# Patient Record
Sex: Male | Born: 1935 | Race: White | Hispanic: No | Marital: Married | Smoking: Former smoker
Health system: Southern US, Community
[De-identification: ages and names within clinical notes are randomized; demographics above are authoritative.]

## PROBLEM LIST (undated history)

## (undated) DIAGNOSIS — C4490 Unspecified malignant neoplasm of skin, unspecified: Secondary | ICD-10-CM

## (undated) DIAGNOSIS — E785 Hyperlipidemia, unspecified: Secondary | ICD-10-CM

## (undated) DIAGNOSIS — M1990 Unspecified osteoarthritis, unspecified site: Secondary | ICD-10-CM

## (undated) DIAGNOSIS — I714 Abdominal aortic aneurysm, without rupture, unspecified: Secondary | ICD-10-CM

## (undated) DIAGNOSIS — I2510 Atherosclerotic heart disease of native coronary artery without angina pectoris: Secondary | ICD-10-CM

## (undated) DIAGNOSIS — I770 Arteriovenous fistula, acquired: Secondary | ICD-10-CM

## (undated) DIAGNOSIS — C801 Malignant (primary) neoplasm, unspecified: Secondary | ICD-10-CM

## (undated) DIAGNOSIS — E039 Hypothyroidism, unspecified: Secondary | ICD-10-CM

## (undated) DIAGNOSIS — I499 Cardiac arrhythmia, unspecified: Secondary | ICD-10-CM

## (undated) DIAGNOSIS — K469 Unspecified abdominal hernia without obstruction or gangrene: Secondary | ICD-10-CM

## (undated) DIAGNOSIS — Z87438 Personal history of other diseases of male genital organs: Secondary | ICD-10-CM

## (undated) DIAGNOSIS — K8020 Calculus of gallbladder without cholecystitis without obstruction: Secondary | ICD-10-CM

## (undated) DIAGNOSIS — M419 Scoliosis, unspecified: Secondary | ICD-10-CM

## (undated) DIAGNOSIS — N429 Disorder of prostate, unspecified: Secondary | ICD-10-CM

## (undated) DIAGNOSIS — I10 Essential (primary) hypertension: Secondary | ICD-10-CM

## (undated) DIAGNOSIS — I219 Acute myocardial infarction, unspecified: Secondary | ICD-10-CM

## (undated) DIAGNOSIS — E079 Disorder of thyroid, unspecified: Secondary | ICD-10-CM

## (undated) HISTORY — DX: Essential (primary) hypertension: I10

## (undated) HISTORY — DX: Hyperlipidemia, unspecified: E78.5

## (undated) HISTORY — DX: Disorder of thyroid, unspecified: E07.9

## (undated) HISTORY — DX: Arteriovenous fistula, acquired: I77.0

## (undated) HISTORY — DX: Unspecified osteoarthritis, unspecified site: M19.90

## (undated) HISTORY — DX: Unspecified malignant neoplasm of skin, unspecified: C44.90

## (undated) HISTORY — DX: Disorder of prostate, unspecified: N42.9

## (undated) HISTORY — DX: Unspecified abdominal hernia without obstruction or gangrene: K46.9

## (undated) HISTORY — DX: Atherosclerotic heart disease of native coronary artery without angina pectoris: I25.10

## (undated) HISTORY — PX: CHOLECYSTECTOMY: SHX55

## (undated) HISTORY — DX: Personal history of other diseases of male genital organs: Z87.438

## (undated) HISTORY — DX: Malignant (primary) neoplasm, unspecified: C80.1

## (undated) HISTORY — PX: HERNIA REPAIR: SHX51

---

## 1995-12-22 HISTORY — PX: JOINT REPLACEMENT: SHX530

## 2002-08-04 ENCOUNTER — Ambulatory Visit (HOSPITAL_COMMUNITY): Admission: RE | Admit: 2002-08-04 | Discharge: 2002-08-04 | Payer: Self-pay | Admitting: Gastroenterology

## 2002-08-04 ENCOUNTER — Encounter (INDEPENDENT_AMBULATORY_CARE_PROVIDER_SITE_OTHER): Payer: Self-pay

## 2003-05-21 HISTORY — PX: JOINT REPLACEMENT: SHX530

## 2007-02-19 HISTORY — PX: CORONARY ANGIOPLASTY: SHX604

## 2007-02-25 ENCOUNTER — Inpatient Hospital Stay (HOSPITAL_COMMUNITY): Admission: EM | Admit: 2007-02-25 | Discharge: 2007-03-04 | Payer: Self-pay | Admitting: Emergency Medicine

## 2007-02-25 ENCOUNTER — Encounter (INDEPENDENT_AMBULATORY_CARE_PROVIDER_SITE_OTHER): Payer: Self-pay | Admitting: Cardiology

## 2007-04-11 ENCOUNTER — Encounter (HOSPITAL_COMMUNITY): Admission: RE | Admit: 2007-04-11 | Discharge: 2007-07-10 | Payer: Self-pay | Admitting: Cardiology

## 2007-05-03 ENCOUNTER — Emergency Department (HOSPITAL_COMMUNITY): Admission: EM | Admit: 2007-05-03 | Discharge: 2007-05-03 | Payer: Self-pay | Admitting: Emergency Medicine

## 2007-12-23 ENCOUNTER — Encounter: Admission: RE | Admit: 2007-12-23 | Discharge: 2007-12-23 | Payer: Self-pay | Admitting: Cardiology

## 2008-05-20 HISTORY — PX: MELANOMA EXCISION: SHX5266

## 2008-08-21 ENCOUNTER — Encounter: Admission: RE | Admit: 2008-08-21 | Discharge: 2008-08-21 | Payer: Self-pay | Admitting: Cardiology

## 2008-08-28 ENCOUNTER — Ambulatory Visit (HOSPITAL_COMMUNITY): Admission: RE | Admit: 2008-08-28 | Discharge: 2008-08-28 | Payer: Self-pay | Admitting: Cardiology

## 2009-02-18 ENCOUNTER — Ambulatory Visit: Payer: Self-pay | Admitting: Diagnostic Radiology

## 2009-02-18 ENCOUNTER — Emergency Department (HOSPITAL_BASED_OUTPATIENT_CLINIC_OR_DEPARTMENT_OTHER): Admission: EM | Admit: 2009-02-18 | Discharge: 2009-02-18 | Payer: Self-pay | Admitting: Emergency Medicine

## 2009-09-01 ENCOUNTER — Ambulatory Visit (HOSPITAL_COMMUNITY): Admission: RE | Admit: 2009-09-01 | Discharge: 2009-09-01 | Payer: Self-pay | Admitting: Cardiology

## 2010-08-29 ENCOUNTER — Ambulatory Visit (HOSPITAL_COMMUNITY): Admission: RE | Admit: 2010-08-29 | Discharge: 2010-08-29 | Payer: Self-pay | Admitting: Cardiology

## 2011-04-04 NOTE — Discharge Summary (Signed)
NAMEBLANCHARD, WILLHITE NO.:  [id number]   MEDICAL RECORD NO.:  [id number]          PATIENT TYPE:  INP   LOCATION:  2010                         FACILITY:  [facility]   PHYSICIAN:  Armanda Magic, M.D.     DATE OF BIRTH:  07/09/1932   DATE OF ADMISSION:  02/27/2007  DATE OF DISCHARGE:  03/04/2007                               DISCHARGE SUMMARY   DISCHARGE DIAGNOSES:  1. Atrial fibrillation with rapid ventricular response.  Discharged to      home in normal sinus rhythm on amiodarone.  2. Non ST-elevated myocardial infarction, status post bare metal stent      to the left anterior descending, sequential lesion.  3. Systemic anticoagulation with Coumadin.  4. Hypertension.  5. Dyslipidemia.  6. Osteoarthritis.  7. Occasional alcohol use.   HOSPITAL COURSE:  [name] [name] is a 77 year old patient who developed new  onset of atrial fibrillation, who was admitted with erythema and  hypertension.  He denied any chest pain, shortness of breath or  palpitations.  He did call EMS.  On arrival to the emergency room, he  was in AFib with RVR, and initially he was started on IV Cardizem, as  well as beta blocker.   Over the next 24 hours, cardiac enzymes were obtained and were  consistent with a non ST-segment elevated myocardial infarction.  Maximum CK 845 with an MB fraction of 27.7, troponin 0.58.  Other lab  work showed white count of 5.1, hemoglobin 12.8, hematocrit 37.5,  platelets 150, sodium 136, potassium 4.1, BUN 14, creatinine of 1.16,  TSH 1.852, total cholesterol 137, triglycerides 118, HDL 49, LDL 64.   The patient did have a 2-D echo during the hospitalization that showed  an EF of about 60%.  Wall motion evaluation was not adequate during the  study.  Aortic valve thickness was mild to moderately increased.  There  was mild to moderate anular calcification.  PFTs were also done that  showed moderate to severe neuromuscular disease with administration  bronchodilators, no significant response noted.  EKG after the patient  converted to normal sinus rhythm showed first degree AV block and left  axis deviation; otherwise unremarkable.  V/Q scan showed no evidence of  pulmonary embolism.  We did do an ultrasound of the patient's urinary  tract, which showed no evidence of hydronephrosis.  The reason this was  done was because of an elevated creatinine during the initial part of  the patient's stay.   Because of the patient's elevated enzymes, he underwent a cardiac  catheterization on February 27, 2007 under the care of Dr. Armanda Magic.  The patient was found to have the following:  30% osteal left main, 30%  mid circumflex, 80% mid LAD with luminal irregularities in the right  coronary artery.  At this point, Dr. Eldridge Dace was involved in the  patient's care and attempted percutaneous intervention of the LAD.  Two  tandem lesions that received bare metal stent placement.  An aortogram  showed no aneurysmal defects.   Ultimately, the patient converted back into normal sinus rhythm  under  the utilization of a beta blocker and Cardizem.  Ultimately, we did  switch over and start amiodarone on the patient to hopefully keep him  back in sinus rhythm.  He was eventually discharged to home on March 04, 2007 in stable and improved condition.   DISCHARGE MEDICATIONS ARE AS FOLLOWS:  1. Plavix 75 mg a day for one month.  2. Diltiazem-ER 180 mg a day.  3. Protonix 40 mg a day.  4. Metoprolol-ER 100 mg daily.  5. Warfarin 5 mg 1 tablet daily.  6. Amiodarone 200 mg 2 tablets twice a day for 7 days, 2 tablets daily      for 7 days, then 1 tablet daily.  7. Aspirin 325 mg then go down to 81 mg daily.  8. Lisinopril 20 mg 1 tablet daily.  9. Pravachol 80 mg 1 tablet daily.   The patient was instructed to stop Cardura and simvastatin, and  Accuretic.   DISCHARGE INSTRUCTIONS:  Remain on a low-sodium, heart healthy diet.  Coumadin was also  discussed with the patient.  No lifting over 10 pounds  for one week.  Follow up with Dr. Mayford Knife on March 18, 2007 at 3:15, and  follow up with [facility] for an EKG evaluation on March 11, 2007  at 2 p.m.  Also, the Coumadin clinic at [facility] on March 07, 2007 at 3  p.m.      Guy Franco, P.A.      Armanda Magic, M.D.  Electronically Signed    LB/MEDQ  D:  06/11/2007  T:  06/12/2007  Job:  [id number]   cc:   Armanda Magic, M.D.  Lyndee Leo. Janey Greaser, MD

## 2011-04-07 NOTE — Cardiovascular Report (Signed)
NAME:  [name] [name], [name] [name] NO.:  [id number]   MEDICAL RECORD NO.:  [id number]          PATIENT TYPE:  OUT   LOCATION:  CATH                         FACILITY:  [facility]   PHYSICIAN:  Armanda Magic, M.D.     DATE OF BIRTH:  23-Mar-1932   DATE OF PROCEDURE:  02/27/2007  DATE OF DISCHARGE:                            CARDIAC CATHETERIZATION   PROCEDURE:  Coronary angiography.   OPERATOR:  Armanda Magic, M.D.   INDICATIONS:  Non-ST-elevation MI and atrial fibrillation.   COMPLICATIONS:  None.   INTRAVENOUS ACCESS:  Via right femoral artery 6-French sheath.   This is a very pleasant 77 year old white male who presented with an  episode of presyncope and rapid A-Fib.  Cardiac enzymes were markedly  elevated and he now presents for cardiac catheterization.   The patient was brought to the Cardiac Catheterization Laboratory in a  fasting, nonsedated state.  Informed consent was obtained.  The patient  was connected to continuous heart rate and pulse oximetry monitoring and  intermittent blood pressure monitoring.  The right groin was prepped and  draped in a sterile fashion.  One percent Xylocaine was used for local  anesthesia.  Using the modified Seldinger technique, a 6-French sheath  was placed in the right femoral artery.  Under fluoroscopic guidance, a  6-French JL4 catheter was placed into the vicinity of the left coronary  ostium but could not engage the ostium.  The catheter was exchanged out  over a guidewire for a 6-French JL5 catheter which again could not  engage the coronary ostium.  The catheter was exchanged out over a  guidewire for a 6-French CSF catheter which again could not engage the  coronary ostium.  The catheter was exchanged out over a guidewire for a  6-French AL3 catheter which was successfully engaged in the coronary  ostium.  Multiple cine films were taken at 30-degree RAO and 40-degree  LAO views.  This catheter was then exchanged out over a  guidewire for a  6-French JR4 catheter which was placed under fluoroscopic guidance in  the right coronary artery.  Multiple cine films were taken in 30-degree  RAO, 40-degree LAO views.  This catheter was then exchanged out over a  guidewire.  The patient went on to PCI of the LAD.  Left  ventriculography was not performed because of the patient's history of  renal insufficiency when he was admitted to the hospital.   RESULTS:  1. Left main coronary artery has an ostial 30% narrowing and then      bifurcates in the left anterior descending artery and left      circumflex artery.  The left anterior descending artery gives rise      to a first diagonal branch which is widely patent and bifurcates      into two daughter branches, both of which are widely patent.  There      is then between the first and second diagonal branch an 80%      narrowing of the mid LAD before giving off a second diagonal  branch.  The ongoing LAD traverses the apex and is widely patent.   The left circumflex is widely patent in its proximal mid portions.  It  gives rise to a large obtuse marginal branch which has a 30% narrowing  and then bifurcates into two daughter branches, both of which are widely  patent.  The ongoing circumflex traverses the AV groove and is patent.   The right coronary artery is widely patent with luminal irregularities.  Distally, it bifurcates into posterior descending artery and posterior  lateral artery, both of which are widely patent.   ASSESSMENT:  1. One-vessel obstructive coronary disease.  2. Normal left ventricular function by echo.  3. Non-ST elevation myocardial infarction.  4. Paroxysmal atrial fibrillation with rapid ventricular response in      normal sinus rhythm.   PLAN:  PCI of the LAD.  Aspirin, Plavix, IV heparin, and subsequently  Coumadin.  Toprol XL 25 mg daily.  Check a fasting lipid panel.      Armanda Magic, M.D.  Electronically Signed      TT/MEDQ  D:  02/27/2007  T:  02/27/2007  Job:  [id number]   cc:   DR. Janey Greaser

## 2011-04-07 NOTE — Op Note (Signed)
   NAME:  [name], [name]                            ACCOUNT NO.:  [id number]   MEDICAL RECORD NO.:  [id number]                   PATIENT TYPE:  AMB   LOCATION:  ENDO                                 FACILITY:  [facility]   PHYSICIAN:  James L. Malon Kindle., M.D.          DATE OF BIRTH:  Aug 10, 1932   DATE OF PROCEDURE:  08/04/2002  DATE OF DISCHARGE:                                 OPERATIVE REPORT   PROCEDURE:  Colonoscopy and coagulation of polyps.   MEDICATIONS:  Fentanyl 75 mcg, Versed 6 mg IV.   SCOPE:  Adult Olympus video colonoscope.   INDICATION:  Colon cancer screening.   DESCRIPTION OF PROCEDURE:  The procedure had been explained to the patient  and consent obtained.  The patient went through his prep at home.  He felt  somewhat weak and presented slightly hypotensive with a systolic pressure of  90, and we brought him in early and gave him a liter and a half of fluids,  and he felt much better.  The procedure including the risks and benefits had  been discussed in the office.  The adult Olympus video colonoscope was  inserted and advanced under direct visualization.  The prep was quite good.  Using abdominal pressure, we were able to reach the cecum without  difficulty.  The ileocecal valve and appendiceal orifice were seen.  The  scope was withdrawn, and the cecum, ascending colon, hepatic flexure,  transverse colon, splenic flexure, descending, and sigmoid colon seen well  upon removal.  No significant diverticular disease.  No polyps or tumors  seen.  The scope was withdrawn down into the rectum, and there was a 3 mm  polyp that was cauterized with the hot biopsy forceps.  No other polyps were  seen.  The scope was withdrawn.  The patient tolerated the procedure well.   ASSESSMENT:  Rectal polyp, cauterized.   PLAN:  Routine postpolypectomy instructions.  We will check pathology and  make recommendations further after the pathology is returned.                       James L. Malon Kindle., M.D.    Waldron Session  D:  08/04/2002  T:  08/04/2002  Job:  [id number]   cc:   Al Decant. Janey Greaser, M.D.  [address]  [place]  [place] [address]  Fax: [phone]

## 2011-04-07 NOTE — H&P (Signed)
NAME:  [name] [name], [name] [name] NO.:  [id number]   MEDICAL RECORD NO.:  [id number]          PATIENT TYPE:  INP   LOCATION:  0104                         FACILITY:  [facility]   PHYSICIAN:  Ellie Lunch, M.D.      DATE OF BIRTH:  08/14/1932   DATE OF ADMISSION:  02/25/2007  DATE OF DISCHARGE:                              HISTORY & PHYSICAL   PRIMARY CARE PHYSICIAN:  Al Decant. Janey Greaser, M.D. with [facility]  at [facility].   CHIEF COMPLAINT:  Dizziness and new-onset atrial fibrillation.   HISTORY AND PHYSICAL:  [name] [name] is a very pleasant 77 year old white  male with a past medical history of hypertension and BPH that came in  today because he had an episode of dizziness when the patient initially  woke up.  He took his blood pressure and found it to be very low with  systolics in the 70s and thus called EMS.  He did not give any  complaints of chest pain, palpitations or syncope.  He denies any  nausea, vomiting or diaphoresis.  He denies cough, shortness of breath,  sputum production.   PAST MEDICAL HISTORY:  1. Hypertension.  2. Dyslipidemia.  3. Osteoarthritis.  4. Bilateral hip replacement.  5. Obesity.   MEDICATIONS:  1. Doxazosin 4 mg daily.  2. Quinapril/hydrochlorothiazide 20/12 mg daily.  3. Simvastatin 40 mg at bedtime.  4. Alka-Seltzer p.r.n.   ALLERGIES:  No known drug allergies.   SOCIAL HISTORY:  Lives with his spouse.  He has remote history of  tobacco abuse but quit 20 years ago.  Occasional drinker, no drug abuse.   FAMILY HISTORY:  Mother had some form of arrhythmia, most likely atrial  fibrillation and also history of congestive heart failure.   REVIEW OF SYSTEMS:  Per HPI.   PHYSICAL EXAMINATION:  VITAL SIGNS ON ADMISSION:  Temperature was not  measured, blood pressure 90/69, pulse 142, respirations 20, O2  saturations 96% on room air.  GENERAL:  The patient in no acute distress.  HEENT:  Head atraumatic, normocephalic.  Pupils  equal, round, reactive  to light and accommodation.  NECK:  Supple, no JVD.  CHEST:  Clear to auscultation bilaterally.  No rales, rhonchi, wheezing.  CARDIOVASCULAR:  He does have a regular rhythm which is very fast,  probably in the 120s, with no murmurs.  ABDOMEN:  Soft, obese, with positive bowel sounds.  EXTREMITIES:  No clubbing, cyanosis, or edema.  SKIN:  Moist.   LABORATORY DRAWN IN THE EMERGENCY ROOM:  Hemoglobin was 13, white count  8.2, platelets 179.  X-rays showed cardiomegaly and mild pulmonary  vascular congestion.  EKG showed atrial fibrillation with rapid  ventricular response and left anterior fascicular block.   PLAN:  1. Dizziness, most likely secondary to atrial fibrillation with rapid      ventricular response.  The patient has already been started on      Cardizem drip in the emergency room.  Will continue that drip.      Will work him up for secondary causes of atrial fibrillation by  getting a TSH, a D-dimer, and also cardiac enzymes.  Will also get      a 2-D echocardiogram to look at ejection fraction as well as      valves.  Will go ahead and get a cardiology consult and admit him      on a telemetry bed.  2. Hypotension.  Will currently keep all of his blood pressure      medicines on hold and give him some IV fluids boluses.  Hopefully      with rate control blood pressure will come up.  3. Dyslipidemia.  Will continue his simvastatin.      Ellie Lunch, M.D.  Electronically Signed     BP/MEDQ  D:  02/25/2007  T:  02/25/2007  Job:  [id number]

## 2011-04-07 NOTE — Cardiovascular Report (Signed)
NAME:  [name] [name], [name] [name] NO.:  [id number]   MEDICAL RECORD NO.:  [id number]          PATIENT TYPE:  OUT   LOCATION:  CATH                         FACILITY:  [facility]   PHYSICIAN:  Corky Crafts, MDDATE OF BIRTH:  December 10, 1931   DATE OF PROCEDURE:  02/27/2007  DATE OF DISCHARGE:                            CARDIAC CATHETERIZATION   PROCEDURE:  Attempted PCI of the LAD.   OPERATOR:  Dr. Eldridge Dace.   INDICATIONS:  Non-ST elevation MI.   PROCEDURAL NARRATIVE:  The diagnostic angiogram revealed a 75% mid-LAD  lesion.  There was great difficulty in engaging the coronary vessels  with the usual catheters, even during the diagnostic case.  Initially,  an AL-3 guiding catheter was advanced to the ascending aorta and  attempted to intubate the left main without success.  An attempt was  then made with a JL-6 catheter.  The catheter was brought to the  vicinity of the ostium of the left main.  A Prowater wire was advanced  into the LAD successfully; however, a second wire was advanced into the  LAD for additional support with the hopes of being able to pull the  guide catheter into the ostium of the vessel.  When the second wire,  which initially was a Mailman, was inserted, the guide catheter back  out.  This wire was then pulled out.  The Prowater remained inside the  LAD.  A BMW wire was advanced into the left main, and upon entering the  LAD, the guide again backed out and both wires fell out of the coronary  artery.  An attempt was then made with a CLS 4.5 guiding catheter  without success.  The difficulty came from significant abdominal aortic  tortuosity.  Another attempt was made with the AL-3 guiding catheter  without success.  A 7-French system was used in the hopes of the  additional support being able to help [place] advance equipment into the  coronary artery.  Heparin and Integrilin were used because the patient  got heparin during the diagnostic cath.  No  successful intervention  could be performed.   IMPRESSIONS:  1. Dilated aortic root with significant abdominal aortic tortuosity.  2. Unsuccessful percutaneous coronary intervention attempt to the left      anterior descending because of lack of guide support due to the      above impression.   RECOMMENDATIONS/PLAN:  1. Will plan for PCI of the LAD tomorrow via a left brachial approach.  2. Will give the patient aggressive hydration overnight.  He has a      normal EF by echocardiogram.  3. Will load the patient with Plavix.  Will plan to use Angiomax      tomorrow for the intervention.      Corky Crafts, MD  Electronically Signed     JSV/MEDQ  D:  02/27/2007  T:  02/27/2007  Job:  [id number]

## 2011-04-07 NOTE — Cardiovascular Report (Signed)
NAME:  [name] [name], [name] [name] NO.:  [id number]   MEDICAL RECORD NO.:  [id number]          PATIENT TYPE:  INP   LOCATION:  6524                         FACILITY:  [facility]   PHYSICIAN:  Corky Crafts, MDDATE OF BIRTH:  1932-03-31   DATE OF PROCEDURE:  02/28/2007  DATE OF DISCHARGE:                            CARDIAC CATHETERIZATION   REFERRING PHYSICIAN:  Dr. Armanda Magic.   PROCEDURES PERFORMED:  Left heart catheterization.  Ascending aortogram,  percutaneous coronary intervention of the left anterior descending.   OPERATOR:  Corky Crafts, MD   INDICATIONS:  Non-ST-segment elevation myocardial infarction.   DESCRIPTION OF PROCEDURE:  The risks and benefits of PCI were explained  to the patient and informed consent was obtained.  A 6-French sheath was  placed into his left brachial artery because yesterday we had tried to  perform this PCI via the femoral approach and were unsuccessful.  1%  lidocaine was infiltrated into his subcutaneous tissue above the  brachial artery.  A 6-French sheath was placed into the brachial artery  using modified Seldinger technique.  A CLS 4.5 guiding catheter was  advanced to the ascending aorta and into the ostium of the left main.  A  Prowater wire was advanced across the lesion.  A 3.0 x 12 Liberte stent  was advanced to the LAD.  There were tandem lesions in the mid LAD of  70% and 80%.  The 3.0 x 12 was placed across the more proximal lesion in  the mid LAD and deployed at 18 atmospheres for 56 seconds.  This stent  was then post dilated with a 3.75 x 8 mm PowerSail to 14 atmospheres for  26 seconds.  There was an excellent angiographic result.  The more  distal lesion did appear more significant even after nitroglycerin was  administered into the coronary artery.  A 3.0 x 8 mm Liberte stent was  then advanced across the lesion and deployed at 14 atmospheres for 41  seconds.  There was an excellent angiographic result.   There was no  residual stenosis.  There is TIMI III flow.  A pigtail catheter was then  advanced to the ascending aorta and across the aortic valve.  Hemodynamic pressure assessment was performed.  A pullback was performed  under continuous hemodynamic monitoring.  A power injection of contrast  was then done in the LAO position to visualize the aortic root.  The  sheath will be removed using manual compression.   Ascending aortogram:  The aortic root does not appear dilated.  It is  just quite tortuous.  There is no aneurysm noted.  There is mild  calcification in the aortic arch.   HEMODYNAMIC RESULTS:  Left ventricular pressure 143/7. LVEDP of 9 mmHg.  Aortic pressure 143/76 with a mean aortic pressure of 105 mmHg.   IMPRESSION:  1. Successful percutaneous coronary intervention of the mid left      anterior descending with a 3.0 x 12 mm Liberte stent taken to 3.87      mm in diameter and then a 3.0 x 8 mm  stent taken to 3.4 mm in      diameter.  These were spot stents in the mid left anterior      descending.  2. Normal hemodynamics.  3. No ascending aneurysm.   RECOMMENDATIONS:  The patient is to continue aspirin and Plavix for  least 30 days along with other secondary preventions including lipid  lowering therapy and smoking cessation.      Corky Crafts, MD  Electronically Signed     JSV/MEDQ  D:  02/28/2007  T:  02/28/2007  Job:  [id number]

## 2011-04-07 NOTE — Consult Note (Signed)
NAME:  ZAMIR, STAPLES NO.:  [id number]   MEDICAL RECORD NO.:  [id number]          PATIENT TYPE:  INP   LOCATION:  0104                         [facility]AC[facility]   PHYSICIAN:  Armanda Magic, M.D.     DATE OF BIRTH:  Apr 06, 1932   DATE OF CONSULTATION:  02/25/2007  DATE OF DISCHARGE:                                 CONSULTATION   REFERRING PHYSICIAN:  Dr. Catha Gosselin.   CHIEF COMPLAINT:  New onset atrial fibrillation with RVR.   HISTORY OF PRESENT ILLNESS:  This is a 77 year old male with a history  of arrhythmia, although he has never seen a cardiologist before,  hypertension, dyslipidemia who was admitted with dizziness and  hypotension.  Apparently the patient was in his usual state of health  and walked his dogs last evening.  Apparently while walking them, he  tripped over one of his dogs and fell and hit his head suffering a very  mild ecchymotic area over his right eye as well as a small scrape on the  top of his head.  He awakened this morning feeling very weak and dizzy  and called EMS.  He denied any chest pain, shortness of breath,  palpitations.  On arrival in the ER the patient was an atrial  fibrillation with RVR.  He is now on a Cardizem drip at 10 mg per hour  with heart rates anywhere from 100-130 beats per minute and systolic  blood pressure of 106 mmHg.   PAST MEDICAL HISTORY:  1. Hypertension.  2. Dyslipidemia.  3. Osteoarthritis.   PAST SURGICAL HISTORY:  Status post total hip replacement x2.   ALLERGIES:  HE HAS NO KNOWN DRUG ALLERGIES.   SOCIAL HISTORY:  He is married.  He denies any tobacco or IV drug use.  He does occasionally use alcohol, usually one glass of wine and a  cocktail every day and then increased alcohol on the weekends.   FAMILY HISTORY:  Noncontributory.   REVIEW OF SYSTEMS:  Other than what is in the HPI is negative.   MEDICATIONS:  1. Doxazosin 4 mg daily.  2. Quinapril/hydrochlorothiazide 20/12.5 mg a day.  3. Simvastatin 40 mg nightly.   PHYSICAL EXAM:  VITAL SIGNS:  Blood pressure is 106/62, heart rate 100-  130,  GENERAL APPEARANCE:  He is a well-developed, well-nourished white male  in no acute distress.  HEENT:  Benign except for a small ecchymotic area of the right eye and  head.  NECK:  Supple without lymphadenopathy, no bruits.  LUNGS:  Clear to auscultation bilaterally.  HEART:  Irregularly irregular.  No murmurs, rubs or gallops.  Normal S1  and S2.  ABDOMEN:  Benign.  EXTREMITIES:  No edema.   LABORATORY DATA:  Sodium 141, potassium 4.2, chloride 105, bicarb 20,  BUN 23, creatinine 1.8.  White cell count 8.3, hemoglobin 13, hematocrit  37.2, platelet count 179.  CPK is pending and B12.  Troponin less than  0.05.   EKG shows atrial fibrillation with rapid ventricular response at 136  beats per minute, left anterior fascicular block.  No ST changes.   Chest x-ray shows mild pulmonary vascular congestion and cardiomegaly.   ASSESSMENT:  1. New onset atrial fibrillation with rapid ventricular response of      unknown duration.  2. Hypertension, now with decreased blood pressure.  Questionably      secondary to dehydration.  3. Acute renal insufficiency.  Questionably secondary to dehydration.  4. Dyslipidemia 506 MB was normal troponin.  Question secondary to      renal insufficiency with rapid A fib.  5. Recent fall secondary tripping over his dog with no syncope.   PLAN:  Continue IV Cardizem drip for rate control.  Check a TSH and BNP.  A 2-D echocardiogram once heart rate controlled, check a CPK-MB,  troponin q.8h. x3.  If blood pressure improves with IV fluids, would  consider giving Lopressor 2.5 mg IV and then repeat x1.  If his blood  pressure tolerates it, we could start him on low-dose Lopressor; if not,  we may have to try the amiodarone.  I am going to give him one dose of  digoxin 0.25 mg IV now, but will need to be careful with any further  dosing of  digoxin because of his renal insufficiency.      Armanda Magic, M.D.  Electronically Signed     TT/MEDQ  D:  02/25/2007  T:  02/25/2007  Job:  [id number]   cc:   Caryn Bee L. Little, M.D.  Fax: [phone]

## 2011-06-14 ENCOUNTER — Encounter (INDEPENDENT_AMBULATORY_CARE_PROVIDER_SITE_OTHER): Payer: Self-pay | Admitting: General Surgery

## 2011-06-16 ENCOUNTER — Encounter (INDEPENDENT_AMBULATORY_CARE_PROVIDER_SITE_OTHER): Payer: Self-pay | Admitting: General Surgery

## 2011-06-16 ENCOUNTER — Ambulatory Visit (INDEPENDENT_AMBULATORY_CARE_PROVIDER_SITE_OTHER): Payer: Medicare Other | Admitting: General Surgery

## 2011-06-16 VITALS — BP 120/84 | HR 60 | Temp 96.7°F | Ht 69.0 in | Wt 187.8 lb

## 2011-06-16 DIAGNOSIS — K4090 Unilateral inguinal hernia, without obstruction or gangrene, not specified as recurrent: Secondary | ICD-10-CM

## 2011-06-16 NOTE — Progress Notes (Signed)
[name] [name] is a 77 y.o. male.    Chief Complaint  Patient presents with  . Other    eval of rih    HPI HPI This patient presents for evaluation of a bulge in his right groin which has been present for about 3 months. He states that this is intermittently protruding and he notices approximately once per week. It is reducible when lying flat and currently he denies any symptoms from this. He had a prior left inguinal hernia repair with mesh for what he states is a very large inguinal hernia and he has done well from this. He denies any obstructive symptoms such as nausea vomiting or constipation. He does have a fairly complicated medical history but appears to be very active, still working as a Midwife and working in the yard. He states that currently is remodeling at home.  Past Medical History  Diagnosis Date  . Hypertension   . Hyperlipidemia   . Coronary disease   . CHF (congestive heart failure)   . Systemic AV fistula   . Thyroid disease   . Arthritis   . Cancer   . Vitamin D deficiency   . History of BPH   . Skin cancer   . Hernia   . Prostate disease     Past Surgical History  Procedure Date  . Hernia repair   . Hip surgery 1999 & 2004    Rt and Lt hip  . Melanoma excision     lt forehead  . Joint replacement 12/1995    right  . Joint replacement 05/2003    left    Family History  Problem Relation Age of Onset  . Stroke Mother   . Cancer Brother   . Other Father     upper GI hemorrhage    Social History History  Substance Use Topics  . Smoking status: Former Games developer  . Smokeless tobacco: Former Neurosurgeon    Quit date: 06/16/1983  . Alcohol Use: Yes     1 glass of red wine daily - 2 on special occasions    No Known Allergies  Current Outpatient Prescriptions  Medication Sig Dispense Refill  . acetaminophen (TYLENOL) 650 MG CR tablet Take 650 mg by mouth as needed.        Marland Kitchen amiodarone (PACERONE) 200 MG tablet Take 200 mg by mouth daily.        Marland Kitchen  aspirin 81 MG tablet Take 81 mg by mouth daily.        . betamethasone dipropionate (DIPROLENE) 0.05 % cream Apply topically daily.        . calcium carbonate (TUMS EX) 750 MG chewable tablet Chew 1 tablet by mouth as needed.        . ergocalciferol (VITAMIN D2) 50000 UNITS capsule Take 50 mcg by mouth once a week. Taking D3 per med list provided 06/16/11.      Marland Kitchen ezetimibe (ZETIA) 10 MG tablet Take 10 mg by mouth daily.        . fluorouracil (EFUDEX) 5 % cream Apply topically daily.        Marland Kitchen levothyroxine (SYNTHROID, LEVOTHROID) 88 MCG tablet Take 88 mcg by mouth daily.        . nebivolol (BYSTOLIC) 5 MG tablet Take 5 mg by mouth daily.        . rosuvastatin (CRESTOR) 20 MG tablet Take 20 mg by mouth daily.        Marland Kitchen warfarin (COUMADIN) 5 MG tablet Take  5 mg by mouth daily.          Review of Systems Review of Systems  Constitutional: Negative.   HENT: Negative.   Eyes: Negative.   Respiratory: Negative.   Cardiovascular: Negative.        HTN and Hx of MI  Gastrointestinal: Negative.   Genitourinary: Negative.   Musculoskeletal: Positive for myalgias and joint pain.  Skin: Negative.   Neurological: Negative.   Endo/Heme/Allergies: Bruises/bleeds easily.  Psychiatric/Behavioral: Negative.     Physical Exam Physical Exam  Constitutional: He is oriented to person, place, and time. He appears well-developed and well-nourished. No distress.  HENT:  Head: Normocephalic and atraumatic.  Mouth/Throat: No oropharyngeal exudate.  Eyes: Conjunctivae and EOM are normal. Pupils are equal, round, and reactive to light. Right eye exhibits no discharge. Left eye exhibits no discharge. No scleral icterus.  Neck: Normal range of motion. Neck supple. No tracheal deviation present.  Cardiovascular: Normal rate and intact distal pulses.   No murmur heard. Respiratory: Effort normal and breath sounds normal. No stridor. No respiratory distress. He has no wheezes.  GI: Soft. Bowel sounds are normal.  He exhibits no distension and no mass. There is no tenderness. There is no rebound and no guarding.  Genitourinary:       Small to moderate sized, reducible RIH, ?direct, prior LIH scar, no evidence of recurrence  Musculoskeletal: Normal range of motion. He exhibits no edema and no tenderness.  Neurological: He is alert and oriented to person, place, and time.  Skin: Skin is warm and dry. No rash noted. He is not diaphoretic. No erythema. No pallor.  Psychiatric: He has a normal mood and affect. His behavior is normal. Judgment and thought content normal.     Blood pressure 120/84, pulse 60, temperature 96.7 F (35.9 C), temperature source Temporal, height 5\' 9"  (1.753 m), weight 187 lb 12.8 oz (85.186 kg).  Assessment/Plan Right inguinal hernia-reducible I agree that he does have a reducible right inguinal hernia. Currently he is asymptomatic from this. 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 He does however have a fairly complicated medical history with history of atrial fibrillation and history of a myocardial infarction in 2008. Although he does have his history he does seem to be fairly active. We discussed the options for repair and I believe that open repair would probably be the best option for him given his Coumadin use. I recommended that he have an evaluation with Dr. Mayford Knife his cardiologist prior to any surgical intervention to discuss management of his anticoagulation and any preoperative optimization. We will set him up for open right inguinal hernia repair with mesh after he sees Dr. Mayford Knife and she gives [place] the okay to proceed. We did also discuss with him the options for watchful waiting and discussed the [facility] study which addressed this alternative. Again, he is interested in repair.  Lodema Pilot DAVID 06/16/2011, 10:25 AM

## 2011-06-16 NOTE — Patient Instructions (Signed)
Follow up with Dr. Mayford Knife for clearance prior to surgery.

## 2011-06-19 ENCOUNTER — Encounter (INDEPENDENT_AMBULATORY_CARE_PROVIDER_SITE_OTHER): Payer: Self-pay | Admitting: General Surgery

## 2011-07-27 ENCOUNTER — Ambulatory Visit (HOSPITAL_COMMUNITY): Admission: RE | Admit: 2011-07-27 | Payer: Medicare Other | Source: Ambulatory Visit | Admitting: General Surgery

## 2011-07-28 ENCOUNTER — Other Ambulatory Visit (INDEPENDENT_AMBULATORY_CARE_PROVIDER_SITE_OTHER): Payer: Self-pay | Admitting: General Surgery

## 2011-07-28 ENCOUNTER — Encounter (HOSPITAL_COMMUNITY): Payer: Medicare Other

## 2011-07-28 LAB — CBC
MCH: 30.4 pg (ref 26.0–34.0)
MCV: 92.9 fL (ref 78.0–100.0)
Platelets: 218 10*3/uL (ref 150–400)
RBC: 4.8 MIL/uL (ref 4.22–5.81)
RDW: 13.9 % (ref 11.5–15.5)

## 2011-07-28 LAB — BASIC METABOLIC PANEL
BUN: 22 mg/dL (ref 6–23)
Calcium: 9.3 mg/dL (ref 8.4–10.5)
Glucose, Bld: 78 mg/dL (ref 70–99)
Potassium: 4.3 mEq/L (ref 3.5–5.1)
Sodium: 135 mEq/L (ref 135–145)

## 2011-07-28 LAB — APTT: aPTT: 56 seconds — ABNORMAL HIGH (ref 24–37)

## 2011-08-02 ENCOUNTER — Ambulatory Visit (HOSPITAL_COMMUNITY)
Admission: RE | Admit: 2011-08-02 | Discharge: 2011-08-02 | Disposition: A | Discharge status: Home / Self Care | Payer: Medicare Other | Source: Ambulatory Visit | Attending: General Surgery | Admitting: General Surgery

## 2011-08-02 DIAGNOSIS — K4090 Unilateral inguinal hernia, without obstruction or gangrene, not specified as recurrent: Secondary | ICD-10-CM | POA: Insufficient documentation

## 2011-08-02 DIAGNOSIS — I10 Essential (primary) hypertension: Secondary | ICD-10-CM | POA: Insufficient documentation

## 2011-08-02 DIAGNOSIS — I4891 Unspecified atrial fibrillation: Secondary | ICD-10-CM | POA: Insufficient documentation

## 2011-08-02 DIAGNOSIS — I2510 Atherosclerotic heart disease of native coronary artery without angina pectoris: Secondary | ICD-10-CM | POA: Insufficient documentation

## 2011-08-02 DIAGNOSIS — E785 Hyperlipidemia, unspecified: Secondary | ICD-10-CM | POA: Insufficient documentation

## 2011-08-02 DIAGNOSIS — Z79899 Other long term (current) drug therapy: Secondary | ICD-10-CM | POA: Insufficient documentation

## 2011-08-02 LAB — PROTIME-INR
INR: 1.25 (ref 0.00–1.49)
Prothrombin Time: 16 seconds — ABNORMAL HIGH (ref 11.6–15.2)

## 2011-08-03 NOTE — Op Note (Signed)
NAME:  MARCAS, BOWSHER NO.:  [id number]  MEDICAL RECORD NO.:  [id number]  LOCATION:  [facility]                         FACILITY:  [facility]  PHYSICIAN:  Lodema Pilot, MD       DATE OF BIRTH:  06-Jun-1932  DATE OF PROCEDURE:  08/02/2011 DATE OF DISCHARGE:                              OPERATIVE REPORT   PROCEDURE:  Open right inguinal hernia repair with mesh.  PREOPERATIVE DIAGNOSIS:  Right inguinal hernia.  POSTOPERATIVE DIAGNOSIS:  Right inguinal hernia.  SURGEON:  Lodema Pilot, MD  ASSISTANT:  None.  ANESTHESIA:  General, LMA anesthesia with 30 cc of 1% lidocaine with epinephrine, and 0.25% Marcaine in a 50:50 mixture.  FLUIDS:  1100 cc of crystalloid.  ESTIMATED BLOOD LOSS:  25 cc.  DRAINS:  None.  SPECIMENS:  None.  COMPLICATIONS:  None.  FINDINGS:  Indirect and direct inguinal hernia imbrication of inguinal floor and placement of a ProGrip mesh.  INDICATION OF PROCEDURE:  Mr. Higinbotham is a 77 year old male with a symptomatic right inguinal hernia.  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  Risks and benefits of the procedure were discussed with the patient in lay terms and informed consent was obtained.  OPERATIVE DETAILS:  Mr. Farve was seen and evaluated in the preoperative area.  Risks and benefits of procedure were discussed in lay terms. Informed consent was obtained.  Prophylactic antibiotics were given.  He was taken to the operating room and placed on the table in supine position.  His abdomen and groin and scrotum were prepped and draped in a standard surgical fashion and an oblique incision was made in the skin over the inguinal canal.  Dissection was carried down through the subcutaneous tissue using Bovie electrocautery.  The external oblique fascia was identified and sharply  incised and opened along the length of its fibers to the external ring.  The spermatic cord was identified and bluntly dissected circumferentially at the pubic tubercle to allow passage of the Penrose drain for gentle retraction.  I do not identify the ilioinguinal nerve.  The external oblique fascia appeared to be more stuck to the internal oblique muscle in somewhat atypical fashion.  I elevated the external oblique fascia in order to create a pocket for placement of the mesh.  He had a weak inguinal floor with direct inguinal hernia and the inguinal floor was imbricated with figure-of-8, 2-0 Vicryl suture, taking care to avoid injury to the underlying bowel contents.  These were superficial sutures and not used for strength, but mainly to prevent the bulging of the hernia during the repair.  The spermatic cord was skeletonized as well and he had an indirect inguinal hernia in the anteromedial aspect of the cord.  Sac was identified and dissected free from the spermatic cord from the spermatic cord structures.  The vas deferens and cord structures were identified and attempted to preserve and minimize dissection the structures.  The hernia sac was dissected back to its base at the  internal ring and sac was opened up and though he has some fatty contents, there was no evidence of bowel contents.  The sac was closed with a 2-0 Vicryl suture and this was replaced back into the peritoneum and the internal ring was approximated with 2-0 Vicryl sutures as well.  The wound was then inspected for hemostasis, which was taken for electrocautery and a precut Parietex ProGrip mesh was placed in the wound surrounding the spermatic cord.  A slit was already placed in the mesh and the tails of the mesh were passed around the spermatic cord creating a new internal ring.  This Velcro type mesh was well adhered to the abdominal wall.  A 2-0 Prolene suture was placed at the pubic tubercle and the  inferior aspect of the mesh was run with a 2-0 Prolene suture to the shelving edge of the inguinal ligament.  Interrupted 2-0 Prolene sutures were used to secure the mesh to the abdominal wall fascia, cephalad and medially and lateral as well, taking care to avoid injury to neurovascular structures.  The wound was again inspected for hemostasis and wound was irrigated with sterile saline solution.  The tails of the mesh were tacked together to recreate the internal ring and then the external oblique fascia was approximated over the mesh with a running 3- 0 Vicryl suture in order to create a new external ring.  The wound was anesthetized with 30 cc of 1% lidocaine with epinephrine and 0.25% Marcaine in a 50:50 mixture and wound was then irrigated with sterile saline solution and again noted to be hemostatic.  The Scarpa fascia was then approximated with interrupted 3-0 Vicryl sutures and the skin edges were approximated with 4-0 Monocryl subcuticular suture.  Skin was washed and dried and Dermabond was applied.  All sponge, needle, and instrument counts were correct at the end of the case.  The patient tolerated procedure well without apparent complications.          ______________________________ Lodema Pilot, MD     BL/MEDQ  D:  08/02/2011  T:  08/03/2011  Job:  [id number]  Electronically Signed by Lodema Pilot DO on 08/03/2011 01:56:59 PM

## 2011-08-24 ENCOUNTER — Ambulatory Visit (INDEPENDENT_AMBULATORY_CARE_PROVIDER_SITE_OTHER): Payer: Medicare Other | Admitting: General Surgery

## 2011-08-24 VITALS — BP 116/62 | HR 68 | Temp 96.8°F | Resp 16 | Ht 69.0 in | Wt 183.2 lb

## 2011-08-24 DIAGNOSIS — Z5189 Encounter for other specified aftercare: Secondary | ICD-10-CM

## 2011-08-24 DIAGNOSIS — Z4889 Encounter for other specified surgical aftercare: Secondary | ICD-10-CM

## 2011-08-24 NOTE — Progress Notes (Signed)
Subjective:     Patient ID: [name] [name], male   DOB: 28-Aug-1932, 77 y.o.   MRN: [id number]  HPI This patient follows up for approximately 3-4 weeks status post open right inguinal hernia repair with mesh. He states he is doing very well and is off his pain medication. He had little swelling and discomfort he says much decreased from his prior hernia repair on the other side. He is tolerating regular diet and his bowels are functioning normally and has no complaints.  Review of Systems     Objective:   Physical Exam In no acute distress and nontoxic-appearing vital signs are normal today His incision is healing well without sign of infection there is no evidence of recurrent hernia     Assessment:     Status post open right inguinal hernia appeared doing very well.    Plan:     He is doing very well his incision is healing well no sign of recurrence and he can increase activity as tolerated and follow up on a p.r.n. basis

## 2012-02-23 ENCOUNTER — Emergency Department (INDEPENDENT_AMBULATORY_CARE_PROVIDER_SITE_OTHER): Payer: Medicare Other

## 2012-02-23 ENCOUNTER — Other Ambulatory Visit: Payer: Self-pay

## 2012-02-23 ENCOUNTER — Encounter (HOSPITAL_BASED_OUTPATIENT_CLINIC_OR_DEPARTMENT_OTHER): Payer: Self-pay | Admitting: *Deleted

## 2012-02-23 ENCOUNTER — Emergency Department (HOSPITAL_BASED_OUTPATIENT_CLINIC_OR_DEPARTMENT_OTHER)
Admission: EM | Admit: 2012-02-23 | Discharge: 2012-02-23 | Disposition: A | Discharge status: Home / Self Care | Payer: Medicare Other | Attending: Emergency Medicine | Admitting: Emergency Medicine

## 2012-02-23 DIAGNOSIS — Z79899 Other long term (current) drug therapy: Secondary | ICD-10-CM | POA: Insufficient documentation

## 2012-02-23 DIAGNOSIS — N281 Cyst of kidney, acquired: Secondary | ICD-10-CM

## 2012-02-23 DIAGNOSIS — K859 Acute pancreatitis without necrosis or infection, unspecified: Secondary | ICD-10-CM | POA: Insufficient documentation

## 2012-02-23 DIAGNOSIS — I714 Abdominal aortic aneurysm, without rupture, unspecified: Secondary | ICD-10-CM

## 2012-02-23 DIAGNOSIS — R109 Unspecified abdominal pain: Secondary | ICD-10-CM

## 2012-02-23 DIAGNOSIS — I509 Heart failure, unspecified: Secondary | ICD-10-CM | POA: Insufficient documentation

## 2012-02-23 DIAGNOSIS — I2510 Atherosclerotic heart disease of native coronary artery without angina pectoris: Secondary | ICD-10-CM | POA: Insufficient documentation

## 2012-02-23 DIAGNOSIS — K8020 Calculus of gallbladder without cholecystitis without obstruction: Secondary | ICD-10-CM | POA: Insufficient documentation

## 2012-02-23 DIAGNOSIS — R935 Abnormal findings on diagnostic imaging of other abdominal regions, including retroperitoneum: Secondary | ICD-10-CM

## 2012-02-23 DIAGNOSIS — R1011 Right upper quadrant pain: Secondary | ICD-10-CM | POA: Insufficient documentation

## 2012-02-23 DIAGNOSIS — R630 Anorexia: Secondary | ICD-10-CM | POA: Insufficient documentation

## 2012-02-23 DIAGNOSIS — R110 Nausea: Secondary | ICD-10-CM | POA: Insufficient documentation

## 2012-02-23 DIAGNOSIS — I10 Essential (primary) hypertension: Secondary | ICD-10-CM | POA: Insufficient documentation

## 2012-02-23 DIAGNOSIS — M129 Arthropathy, unspecified: Secondary | ICD-10-CM | POA: Insufficient documentation

## 2012-02-23 DIAGNOSIS — E785 Hyperlipidemia, unspecified: Secondary | ICD-10-CM | POA: Insufficient documentation

## 2012-02-23 LAB — COMPREHENSIVE METABOLIC PANEL
Albumin: 3.5 g/dL (ref 3.5–5.2)
BUN: 16 mg/dL (ref 6–23)
Chloride: 102 mEq/L (ref 96–112)
Creatinine, Ser: 1.2 mg/dL (ref 0.50–1.35)
GFR calc non Af Amer: 57 mL/min — ABNORMAL LOW (ref >90)
Sodium: 135 mEq/L (ref 135–145)

## 2012-02-23 LAB — URINALYSIS, ROUTINE W REFLEX MICROSCOPIC
Glucose, UA: NEGATIVE mg/dL
Hgb urine dipstick: NEGATIVE
Leukocytes, UA: NEGATIVE

## 2012-02-23 LAB — LACTIC ACID, PLASMA: Lactic Acid, Venous: 0.6 mmol/L (ref 0.5–2.2)

## 2012-02-23 LAB — CBC
HCT: 43.1 % (ref 39.0–52.0)
Hemoglobin: 14.6 g/dL (ref 13.0–17.0)
Platelets: 162 10*3/uL (ref 150–400)
WBC: 11.3 10*3/uL — ABNORMAL HIGH (ref 4.0–10.5)

## 2012-02-23 LAB — TROPONIN I: Troponin I: <0.3 ng/mL (ref <0.30)

## 2012-02-23 LAB — PROTIME-INR: Prothrombin Time: 27.4 seconds — ABNORMAL HIGH (ref 11.6–15.2)

## 2012-02-23 MED ORDER — IOHEXOL 300 MG/ML  SOLN
100.0000 mL | Freq: Once | INTRAMUSCULAR | Status: AC | PRN
  Administered 2012-02-23: 100 mL via INTRAVENOUS

## 2012-02-23 MED ORDER — HYDROCODONE-ACETAMINOPHEN 5-325 MG PO TABS: 2.0000 | ORAL_TABLET | ORAL | Status: AC | PRN

## 2012-02-23 MED ORDER — ONDANSETRON HCL 4 MG PO TABS: 4.0000 mg | ORAL_TABLET | Freq: Four times a day (QID) | ORAL | Status: AC

## 2012-02-23 MED ORDER — ONDANSETRON HCL 4 MG/2ML IJ SOLN
4.0000 mg | Freq: Once | INTRAMUSCULAR | Status: AC
  Administered 2012-02-23: 4 mg via INTRAVENOUS
  Filled 2012-02-23: qty 2

## 2012-02-23 NOTE — Discharge Instructions (Signed)
Acute Pancreatitis Followup with Dr. Johna Sheriff regarding her gallstones. Follow up with Dr. Mayford Knife regarding your dilated aorta.  Return to the ED if you develop new or worsening symptoms. The pancreas is a large gland located behind your stomach. It produces (secretes) enzymes. These enzymes help digest food. It also releases the hormones glucagon and insulin. These hormones help regulate blood sugar. When the pancreas becomes inflamed, the disease is called pancreatitis. Inflammation of the pancreas occurs when enzymes from the pancreas begin attacking and digesting the pancreas. CAUSES  Most cases ofsudden onset (acute) pancreatitis are caused by:  Alcohol abuse.   Gallstones.  Other less common causes are:  Some medications.   Exposure to certain chemicals   Infection.   Damage caused by an accident (trauma).   Surgery of the belly (abdomen).  SYMPTOMS  Acute pancreatitis usually begins with pain in the upper abdomen and may radiate to the back. This pain may last a couple days. The constant pain varies from mild to severe. The acute form of this disease may vary from mild, nonspecific abdominal pain to profound shock with coma. About 1 in 5 cases are severe. These patients become dehydrated and develop low blood pressure. In severe cases, bleeding into the pancreas can lead to shock and death. The lungs, heart, and kidneys may fail. DIAGNOSIS  Your caregiver will form a clinical opinion after giving you an exam. Laboratory work is used to confirm this diagnosis. Often,a digestive enzyme from the pancreas (serum amylase) and other enzymes are elevated. Sugars and fats (lipids) in the blood may be elevated. There may also be changes in the following levels: calcium, magnesium, potassium, chloride and bicarbonate (chemicals in the blood). X-rays, a CT scan, or ultrasound of your abdomen may be necessary to search for other causes of your abdominal pain. TREATMENT  Most pancreatitis requires  treatment of symptoms. Most acute attacks last a couple of days. Your caregiver can discuss the treatment options with you.  If complications occur, hospitalization may be necessary for pain control and intravenous (IV) fluid replacement.   Sometimes, a tube may be put into the stomach to control vomiting.   Food may not be allowed for 3 to 4 days. This gives the pancreas time to rest. Giving the pancreas a rest means there is no stimulation that would produce more enzymes and cause more damage.   Medicines (antibiotics) that kill germs may be given if infection is the cause.   Sometimes, surgery may be required.   Following an acute attack, your caregiver will determine the cause, if possible, and offer suggestions to prevent recurrences.  HOME CARE INSTRUCTIONS   Eat smaller, more frequent meals. This reduces the amount of digestive juices the pancreas produces.   Decrease the amount of fat in your diet. This may help reduce loose, diarrheal stools.   Drink enough water and fluids to keep your urine clear or pale yellow. This is to avoid dehydration which can cause increased pain.   Talk to your caregiver about pain relievers or other medicines that may help.   Avoid anything that may have triggered your pancreatitis (for example, alcohol).   Follow the diet advised by your caregiver. Do not advance the diet too soon.   Take medicines as prescribed.   Get plenty of rest.   Check your blood sugar at home as directed by your caregiver.   If your caregiver has given you a follow-up appointment, it is very important to keep that appointment. Not  keeping the appointment could result in a lasting (chronic) or permanent injury, pain, and disability. If there is any problem keeping the appointment, you must call to reschedule.  SEEK MEDICAL CARE IF:   You are not recovering in the time described by your caregiver.   You have persistent pain, weakness, or feel sick to your stomach  (nauseous).   You have recovered and then have another bout of pain.  SEEK IMMEDIATE MEDICAL CARE IF:   You are unable to eat or keep fluids down.   Your pain increases a lot or changes.   You have an oral temperature above 102 F (38.9 C), not controlled by medicine.   Your skin or the white part of your eyes look yellow (jaundice).   You develop vomiting.   You feel dizzy or faint.   Your blood sugar is high (over 300).  MAKE SURE YOU:   Understand these instructions.   Will watch your condition.   Will get help right away if you are not doing well or get worse.  Document Released: 11/06/2005 Document Revised: 10/26/2011 Document Reviewed: 06/19/2008 [facility] Patient Information 2012 [place], [place].Cholelithiasis Cholelithiasis (also called gallstones) is a form of gallbladder disease where gallstones form in your gallbladder. The gallbladder is a non-essential organ that stores bile made in the liver, which helps digest fats. Gallstones begin as small crystals and slowly grow into stones. Gallstone pain occurs when the gallbladder spasms, and a gallstone is blocking the duct. Pain can also occur when a stone passes out of the duct.  Women are more likely to develop gallstones than men. Other factors that increase the risk of gallbladder disease are:  Having multiple pregnancies. Physicians sometimes advise removing diseased gallbladders before future pregnancies.   Obesity.   Diets heavy in fried foods and fat.   Increasing age (older than 63).   Prolonged use of medications containing male hormones.   Diabetes mellitus.   Rapid weight loss.   Family history of gallstones (heredity).  SYMPTOMS  Feeling sick to your stomach (nauseous).   Abdominal pain.   Yellowing of the skin (jaundice).   Sudden pain. It may persist from several minutes to several hours.   Worsening pain with deep breathing or when jarred.   Fever.   Tenderness to the touch.  In  some cases, when gallstones do not move into the bile duct, people have no pain or symptoms. These are called "silent" gallstones. TREATMENT In severe cases, emergency surgery may be required. HOME CARE INSTRUCTIONS   Only take over-the-counter or prescription medicines for pain, discomfort, or fever as directed by your caregiver.   Follow a low-fat diet until seen again. Fat causes the gallbladder to contract, which can result in pain.   Follow up as instructed. Attacks are almost always recurrent and surgery is usually required for permanent treatment.  SEEK IMMEDIATE MEDICAL CARE IF:   Your pain increases and is not controlled by medications.   You have an oral temperature above 102 F (38.9 C), not controlled by medication.   You develop nausea and vomiting.  MAKE SURE YOU:   Understand these instructions.   Will watch your condition.   Will get help right away if you are not doing well or get worse.  Document Released: 11/02/2005 Document Revised: 10/26/2011 Document Reviewed: 01/05/2011 [facility] Patient Information 2012 [place], [place].

## 2012-02-23 NOTE — ED Provider Notes (Signed)
History     CSN: [id number]  Arrival date & time 02/23/12  1445   First MD Initiated Contact with Patient 02/23/12 1506      Chief Complaint  Patient presents with  . Abdominal Pain    (Consider location/radiation/quality/duration/timing/severity/associated sxs/prior treatment) HPI Comments: Patient presents with right upper quadrant pain that woke him from sleep last night. It is sharp and associated with nausea. It waxed and waned throughout the night and has been persistent throughout the day today has had diminished appetite but no vomiting. He denies any chest pain, shortness of breath, fever, cough urinary symptoms. The pain is now spread across his epigastrium and left upper quadrant as well. Last normal bowel movement was yesterday. He has a history of MI with stents and not had chest pain with his heart attack. No previous abdominal surgeries.  The history is provided by the patient and the spouse.    Past Medical History  Diagnosis Date  . Hypertension   . Hyperlipidemia   . Coronary disease   . CHF (congestive heart failure)   . Systemic AV fistula   . Thyroid disease   . Arthritis   . Cancer   . Vitamin d deficiency   . History of BPH   . Skin cancer   . Hernia   . Prostate disease     Past Surgical History  Procedure Date  . Hernia repair   . Hip surgery 1999 & 2004    Rt and Lt hip  . Melanoma excision     lt forehead  . Joint replacement 12/1995    right  . Joint replacement 05/2003    left    Family History  Problem Relation Age of Onset  . Stroke Mother   . Cancer Brother   . Other Father     upper GI hemorrhage    History  Substance Use Topics  . Smoking status: Former Games developer  . Smokeless tobacco: Former Neurosurgeon    Quit date: 06/16/1983  . Alcohol Use: Yes     1 glass of red wine daily - 2 on special occasions      Review of Systems  Constitutional: Positive for appetite change. Negative for fever and activity change.  HENT: Negative  for congestion and rhinorrhea.   Eyes: Negative for visual disturbance.  Respiratory: Negative for cough, chest tightness and shortness of breath.   Cardiovascular: Negative for chest pain.  Gastrointestinal: Positive for nausea and abdominal pain. Negative for vomiting and diarrhea.  Genitourinary: Negative for dysuria, hematuria and testicular pain.  Musculoskeletal: Negative for back pain.  Skin: Negative for rash.  Neurological: Negative for dizziness, weakness and headaches.    Allergies  Review of patient's allergies indicates no known allergies.  Home Medications   Current Outpatient Rx  Name Route Sig Dispense Refill  . ACETAMINOPHEN ER 650 MG PO TBCR Oral Take 650 mg by mouth as needed.      . AMIODARONE HCL 200 MG PO TABS Oral Take 200 mg by mouth daily.      Marland Kitchen CALCIUM CARBONATE ANTACID 750 MG PO CHEW Oral Chew 1 tablet by mouth as needed.      Marland Kitchen VITAMIN D3 2000 UNITS PO TABS Oral Take by mouth daily.      Marland Kitchen EZETIMIBE 10 MG PO TABS Oral Take 10 mg by mouth daily.      Marland Kitchen LEVOTHYROXINE SODIUM 88 MCG PO TABS Oral Take 88 mcg by mouth daily.      Marland Kitchen  NEBIVOLOL HCL 5 MG PO TABS Oral Take 5 mg by mouth daily.      Marland Kitchen ROSUVASTATIN CALCIUM 20 MG PO TABS Oral Take 20 mg by mouth daily.      . WARFARIN SODIUM 2.5 MG PO TABS Oral Take 2.5 mg by mouth daily.      Marland Kitchen BETAMETHASONE DIPROPIONATE 0.05 % EX CREA Topical Apply topically as needed.     Marland Kitchen FLUOROURACIL 5 % EX CREA Topical Apply topically as needed.     Marland Kitchen HYDROCODONE-ACETAMINOPHEN 5-325 MG PO TABS Oral Take 2 tablets by mouth every 4 (four) hours as needed for pain. 10 tablet 0  . ONDANSETRON HCL 4 MG PO TABS Oral Take 1 tablet (4 mg total) by mouth every 6 (six) hours. 12 tablet 0    BP 125/72  Pulse 66  Temp(Src) 97.9 F (36.6 C) (Oral)  Resp 18  Ht 5\' 9"  (1.753 m)  Wt 184 lb (83.462 kg)  BMI 27.17 kg/m2  SpO2 98%  Physical Exam  Constitutional: He is oriented to person, place, and time. He appears well-developed and  well-nourished. No distress.       No distress, reading a book  HENT:  Head: Normocephalic and atraumatic.  Mouth/Throat: Oropharynx is clear and moist. No oropharyngeal exudate.  Eyes: Conjunctivae and EOM are normal. Pupils are equal, round, and reactive to light.  Neck: Normal range of motion. Neck supple.  Cardiovascular: Normal rate, regular rhythm and normal heart sounds.   Pulmonary/Chest: Effort normal and breath sounds normal. No respiratory distress.  Abdominal: Soft. There is tenderness. There is no rebound and no guarding.       Mild right upper quadrant epigastric pain  Musculoskeletal: Normal range of motion. He exhibits no edema and no tenderness.       No CVA tenderness  Neurological: He is alert and oriented to person, place, and time. No cranial nerve deficit.  Skin: Skin is warm.    ED Course  Procedures (including critical care time)  Labs Reviewed  URINALYSIS, ROUTINE W REFLEX MICROSCOPIC - Abnormal; Notable for the following:    Color, Urine AMBER (*) BIOCHEMICALS MAY BE AFFECTED BY COLOR   Ketones, ur 15 (*)    All other components within normal limits  CBC - Abnormal; Notable for the following:    WBC 11.3 (*)    All other components within normal limits  COMPREHENSIVE METABOLIC PANEL - Abnormal; Notable for the following:    Total Bilirubin 2.0 (*)    GFR calc non Af Amer 57 (*)    GFR calc Af Amer 66 (*)    All other components within normal limits  LIPASE, BLOOD - Abnormal; Notable for the following:    Lipase 86 (*)    All other components within normal limits  PROTIME-INR - Abnormal; Notable for the following:    Prothrombin Time 27.4 (*)    INR 2.50 (*)    All other components within normal limits  LACTIC ACID, PLASMA  TROPONIN I   US Abdomen Complete  02/23/2012  *RADIOLOGY REPORT*  Clinical Data:  Abdominal pain beginning earlier this morning  COMPLETE ABDOMINAL ULTRASOUND  Comparison:  Renal ultrasound - 02/26/2007  Findings:  Gallbladder:   There is a mobile approximately 1.2 cm stone within an otherwise normal-appearing gallbladder.  No gallbladder wall thickening.  No pericholecystic fluid.  Common bile duct:  Normal in size, measuring 2.7 mm in diameter.  Liver:  Homogeneous hepatic echotexture.  No discrete hepatic lesion.  No ascites.  IVC:  Appears normal.  Pancreas:  Limited visualization of the pancreatic head and neck is normal.  The pancreatic body and tail is obscured by bowel gas.  Spleen:  Measures  Right Kidney:  Normal cortical thickness, echogenicity and size, measuring 11.3 cm in length.  There is an approximately 3.8 x 3.8 x 4.1 cm partially exophytic cyst arising from the mid aspect of the right kidney, increased in size in the interval, 2.7 x 2.8 x 3.1 cm.  There is an additional sub centimeter anechoic lesion within the right kidney which is too small to actively characterize of favored to represent additional renal cyst. Interval development of an additional approximately 2.3 x 1.5 x 2.4 cm anechoic parapelvic cyst. No definite urinary obstruction.  No echogenic renal stones.  Left Kidney:  Normal cortical thickness, echogenicity and size, measuring 11.6 cm in length.  Interval increase in size of partially exophytic cyst arising from the superior pole right kidney, now measuring 6.2 x 4.9 x 5.5 cm (previously, 5.1 x 4.1 x 3.8 cm).  An additional exophytic cyst arising the left kidney now measures 3.1 x 2.9 x 3.3 cm (previously, 2.2 x 1.7 x 2.2 cm). No echogenic renal stones.  No urinary obstruction.  Abdominal aorta:  Atherosclerotic plaque within the nonaneurysmal abdominal aorta.  Of note, the distal aspect of the abdominal aorta is suboptimally visualized secondary to bowel gas.  IMPRESSION:  1.  Cholelithiasis without definite evidence of cholecystitis.  2.  Interval increase in size of previously identified bilateral renal cysts with development of new right-sided renal cysts. No urinary obstruction.  Original Report  Authenticated By: Waynard Reeds, M.D.   Ct Abdomen Pelvis W Contrast  02/23/2012  *RADIOLOGY REPORT*  Clinical Data: Upper abdominal pain, nausea, hypertension, diabetes  CT ABDOMEN AND PELVIS WITH CONTRAST  Technique:  Multidetector CT imaging of the abdomen and pelvis was performed following the standard protocol during bolus administration of intravenous contrast.  Contrast: OMNIPAQUE IOHEXOL 300 MG/ML  SOLN  Comparison: None  Findings: A calcified granuloma is present within the posterior left lower lobe. The liver enhances with no focal abnormality and no ductal dilatation is seen.  There is at least one calcified gallstone within the gallbladder of approximately 8 mm in diameter. The gallbladder wall is not thickened.  However, there is abnormality of the pancreatic head and uncinate process which together measure 4.3 cm in diameter.  There is loss of the peripancreatic fat planes around the head of the pancreas, findings most consistent with focal acute pancreatitis.  In addition, the pancreatic duct is not dilated within the body or tail of the pancreas.  There does appear to be edema of the adjacent duodenal loop favoring an inflammatory process.  The adrenal glands and spleen are unremarkable.  The stomach is not well distended but no abnormality is seen.  The kidneys enhance and there are bilateral renal cyst which appears simple.  The abdominal aorta does minimally dilate distally up to 3.0 cm, and follow-up is recommended to assess for developing abdominal aortic aneurysm.  No adenopathy is seen.  Some of the anatomy of the pelvis is obscured by artifacts created by total hip replacements bilaterally.  The urinary bladder is grossly unremarkable.  There are multiple rectosigmoid colonic diverticula present.  The terminal ileum is unremarkable.  There is a thoracolumbar scoliosis present with diffuse degenerative change in diffuse degenerative disc disease of the lumbar spine.  IMPRESSION:  1.   Enlargement  of the head of the pancreas with loss of peripancreatic fat planes most consistent with focal acute pancreatitis.  Recommend follow-up to exclude underlying neoplasm. 2.  Single 8 mm calcified gallstone within the gallbladder. 3.  Edema of the descending duodenum secondary to the presumed focal pancreatitis of the head of the pancreas. 4. Small focal distal abdominal aortic aneurysm of 3.0 cm. Recommend follow-up.  Original Report Authenticated By: Juline Patch, M.D.     1. Pancreatitis   2. Cholelithiasis   3. AAA (abdominal aortic aneurysm)       MDM  Right upper quadrant pain with nausea. Vital signs stable. Abdomen without peritoneal signs.  Labs, EKG, symptom control, right upper quadrant ultrasound  Cholelithiasis without cholecystitis. LFTs normal. Common bile but normal. Mild elevation of lipase at 86.  CT shows acute pancreatitis, incidental small abdominal aortic aneurysm.  Patient made aware findings. States he feels well and is tolerating by mouth in the room. Denies any pain. Discharged for followup with surgery and vascular surgery   Date: 02/23/2012  Rate: 65  Rhythm: normal sinus rhythm  QRS Axis: left  Intervals: normal  ST/T Wave abnormalities: nonspecific ST/T changes  Conduction Disutrbances:none  Narrative Interpretation:   Old EKG Reviewed: unchanged      Glynn Octave, MD 02/24/12 [phone]

## 2012-02-23 NOTE — ED Notes (Signed)
Pt sts he had a sudden onset of stabbing RUQ pain that began last night. Pt sts today the pain has spread across his difuse upper abd. Pt sts nausea began apprx. 1 hour PTA.

## 2012-02-26 ENCOUNTER — Telehealth (INDEPENDENT_AMBULATORY_CARE_PROVIDER_SITE_OTHER): Payer: Self-pay | Admitting: General Surgery

## 2012-02-27 NOTE — Telephone Encounter (Signed)
Called patient with his follow up appointment date & time for 03/01/12 @ 10:00 w/Dr. Biagio Quint

## 2012-03-01 ENCOUNTER — Encounter (INDEPENDENT_AMBULATORY_CARE_PROVIDER_SITE_OTHER): Payer: Self-pay | Admitting: General Surgery

## 2012-03-01 ENCOUNTER — Ambulatory Visit (INDEPENDENT_AMBULATORY_CARE_PROVIDER_SITE_OTHER): Payer: Medicare Other | Admitting: General Surgery

## 2012-03-01 VITALS — BP 108/64 | HR 80 | Temp 97.3°F | Resp 16 | Ht 69.0 in | Wt 179.4 lb

## 2012-03-01 DIAGNOSIS — K8020 Calculus of gallbladder without cholecystitis without obstruction: Secondary | ICD-10-CM

## 2012-03-01 DIAGNOSIS — K859 Acute pancreatitis without necrosis or infection, unspecified: Secondary | ICD-10-CM

## 2012-03-01 DIAGNOSIS — K851 Biliary acute pancreatitis without necrosis or infection: Secondary | ICD-10-CM

## 2012-03-01 NOTE — Progress Notes (Signed)
Patient ID: [name] [name], male   DOB: [birth year], 78 y.o.   MRN: [id number]  Chief Complaint  Patient presents with  . New Evaluation    gallstones    HPI [name] [name] is a 78 y.o. male.   HPI This patient presents for evaluation of right upper quadrant pain and cholelithiasis. Last Thursday he was awoken from his sleep with severe right upper quadrant pain which radiated across his upper abdomen. He cannot describe this but he says it was close there" he attempted to get relief with over-the-counter medication such as Alka-Seltzer but he obtained no relief and he ended up going to the emergency room for evaluation. There he had an ultrasound which demonstrated cholelithiasis and some edema around the pancreatic head concerning for possible pancreatitis he had a followup CT scan which demonstrated cholelithiasis and possible pancreatitis and a 3 cm aortic aneurysm. He will receive some medication in the emergency room and his pain was relieved and he was discharged home. He had a little bit of discomfort at home but since then he has not had any significant symptoms. He denied any associated nausea or vomiting. He denies any heartburn. He states that his bowels are normal. He denies any prior episodes of similar discomfort or any food intolerances. He is on Coumadin for history of atrial fibrillation and history of myocardial infarction in 2008 but was able to come off of this in September for an elective hernia repair.  Past Medical History  Diagnosis Date  . Hypertension   . Hyperlipidemia   . Coronary disease   . CHF (congestive heart failure)   . Systemic AV fistula   . Thyroid disease   . Arthritis   . Cancer   . Vitamin d deficiency   . History of BPH   . Skin cancer   . Hernia   . Prostate disease     Past Surgical History  Procedure Date  . Hernia repair   . Hip surgery 1999 & 2004    Rt and Lt hip  . Melanoma excision     lt forehead  . Joint replacement 12/1995    right    . Joint replacement 05/2003    left    Family History  Problem Relation Age of Onset  . Stroke Mother   . Cancer Brother   . Other Father     upper GI hemorrhage    Social History History  Substance Use Topics  . Smoking status: Former Games developer  . Smokeless tobacco: Former Neurosurgeon    Quit date: 06/16/1983  . Alcohol Use: Yes     1 glass of red wine daily - 2 on special occasions    No Known Allergies  Current Outpatient Prescriptions  Medication Sig Dispense Refill  . acetaminophen (TYLENOL) 650 MG CR tablet Take 650 mg by mouth as needed.        Marland Kitchen amiodarone (PACERONE) 200 MG tablet Take 200 mg by mouth daily.        . betamethasone dipropionate (DIPROLENE) 0.05 % cream Apply topically as needed.       . calcium carbonate (TUMS EX) 750 MG chewable tablet Chew 1 tablet by mouth as needed.        . Cholecalciferol (VITAMIN D3) 2000 UNITS TABS Take by mouth daily.        Marland Kitchen ezetimibe (ZETIA) 10 MG tablet Take 10 mg by mouth daily.        . fluorouracil (EFUDEX) 5 %  cream Apply topically as needed.       Marland Kitchen HYDROcodone-acetaminophen (NORCO) 5-325 MG per tablet Take 2 tablets by mouth every 4 (four) hours as needed for pain.  10 tablet  0  . levothyroxine (SYNTHROID, LEVOTHROID) 88 MCG tablet Take 88 mcg by mouth daily.        . nebivolol (BYSTOLIC) 5 MG tablet Take 5 mg by mouth daily.        . ondansetron (ZOFRAN) 4 MG tablet Take 1 tablet (4 mg total) by mouth every 6 (six) hours.  12 tablet  0  . rosuvastatin (CRESTOR) 20 MG tablet Take 20 mg by mouth daily.        Marland Kitchen warfarin (COUMADIN) 2.5 MG tablet Take 2.5 mg by mouth daily.          Review of Systems Review of Systems All other review of systems negative or noncontributory except as stated in the HPI  Blood pressure 108/64, pulse 80, temperature 97.3 F (36.3 C), temperature source Temporal, resp. rate 16, height 5\' 9"  (1.753 m), weight 179 lb 6 oz (81.364 kg).  Physical Exam Physical Exam Physical Exam  Vitals  reviewed. Constitutional: He is oriented to person, place, and time. He appears well-developed and well-nourished. No distress.  HENT:  Head: Normocephalic and atraumatic.  Mouth/Throat: No oropharyngeal exudate.  Eyes: Conjunctivae and EOM are normal. Pupils are equal, round, and reactive to light. Right eye exhibits no discharge. Left eye exhibits no discharge. No scleral icterus.  Neck: Normal range of motion. No tracheal deviation present.  Cardiovascular: Normal rate, regular rhythm and normal heart sounds.   Pulmonary/Chest: Effort normal and breath sounds normal. No stridor. No respiratory distress. He has no wheezes. He has no rales. He exhibits no tenderness.  Abdominal: Soft. Bowel sounds are normal. He exhibits no distension and no mass. There is no tenderness. There is no rebound and no guarding.  Musculoskeletal: Normal range of motion. He exhibits no edema and no tenderness.  Neurological: He is alert and oriented to person, place, and time.  Skin: Skin is warm and dry. No rash noted. He is not diaphoretic. No erythema. No pallor.  Psychiatric: He has a normal mood and affect. His behavior is normal. Judgment and thought content normal.    Data Reviewed [place], CT, labs  Assessment    Symptomatic cholelithiasis and possible gallstone pancreatitis His symptoms are concerning for symptomatic cholelithiasis and even possible choledocholithiasis and gallstone pancreatitis. He had mild elevation of his lipase and elevation of his bilirubin as well concerning for possible passage of common bile duct stone. He does have gallstones on ultrasound and on CT scan and his history is fairly good for symptomatic cholelithiasis. I discussed with him the options for cholecystectomy and the risks of the procedure. The risks of infection, bleeding, pain, persistent symptoms, scarring, injury to bowel or bile ducts, retained stone, diarrhea, need for additional procedures, and need for open surgery  discussed with the patient.He would like to proceed with laparoscopic cholecystectomy with possible couldn't examine possible open surgery. He recently received clearance for an elective inguinal hernia surgery in September by his doctor, Dr. Mayford Knife. I think it would be reasonable to stop his Coumadin preoperatively since he was on this for atrial fibrillation.     Plan    We will set him up for cholecystectomy when convenient for the patient. We will plan to stop his Coumadin for 5-7 days preoperatively.       Lodema Pilot [name] [name] 03/01/2012,  10:43 AM

## 2012-03-15 ENCOUNTER — Encounter (HOSPITAL_COMMUNITY): Payer: Self-pay

## 2012-03-18 ENCOUNTER — Encounter (HOSPITAL_COMMUNITY)
Admission: RE | Admit: 2012-03-18 | Discharge: 2012-03-18 | Disposition: A | Discharge status: Home / Self Care | Payer: Medicare Other | Source: Ambulatory Visit | Attending: General Surgery | Admitting: General Surgery

## 2012-03-18 ENCOUNTER — Ambulatory Visit (HOSPITAL_COMMUNITY)
Admission: RE | Admit: 2012-03-18 | Discharge: 2012-03-18 | Disposition: A | Discharge status: Home / Self Care | Payer: Medicare Other | Source: Ambulatory Visit | Attending: General Surgery | Admitting: General Surgery

## 2012-03-18 ENCOUNTER — Encounter (HOSPITAL_COMMUNITY): Payer: Self-pay

## 2012-03-18 DIAGNOSIS — Z01818 Encounter for other preprocedural examination: Secondary | ICD-10-CM | POA: Insufficient documentation

## 2012-03-18 DIAGNOSIS — Z01812 Encounter for preprocedural laboratory examination: Secondary | ICD-10-CM | POA: Insufficient documentation

## 2012-03-18 HISTORY — DX: Scoliosis, unspecified: M41.9

## 2012-03-18 HISTORY — DX: Acute myocardial infarction, unspecified: I21.9

## 2012-03-18 HISTORY — DX: Hypothyroidism, unspecified: E03.9

## 2012-03-18 HISTORY — DX: Abdominal aortic aneurysm, without rupture: I71.4

## 2012-03-18 HISTORY — DX: Cardiac arrhythmia, unspecified: I49.9

## 2012-03-18 HISTORY — DX: Abdominal aortic aneurysm, without rupture, unspecified: I71.40

## 2012-03-18 HISTORY — DX: Calculus of gallbladder without cholecystitis without obstruction: K80.20

## 2012-03-18 HISTORY — DX: Atherosclerotic heart disease of native coronary artery without angina pectoris: I25.10

## 2012-03-18 LAB — CBC
HCT: 44.3 % (ref 39.0–52.0)
MCV: 92.9 fL (ref 78.0–100.0)
RBC: 4.77 MIL/uL (ref 4.22–5.81)
RDW: 14 % (ref 11.5–15.5)
WBC: 4.6 10*3/uL (ref 4.0–10.5)

## 2012-03-18 LAB — BASIC METABOLIC PANEL
BUN: 20 mg/dL (ref 6–23)
CO2: 24 mEq/L (ref 19–32)
Chloride: 108 mEq/L (ref 96–112)
Creatinine, Ser: 1.33 mg/dL (ref 0.50–1.35)

## 2012-03-18 LAB — SURGICAL PCR SCREEN: MRSA, PCR: NEGATIVE

## 2012-03-18 NOTE — Pre-Procedure Instructions (Signed)
CARDIOLOGY OFFICE NOTES 03/01/12, NUCLEAR STRESS TEST REPORT 8/2//2012, ECHO REPORT 02/22/11 ON THIS CHART FROM DR. TRACI TURNER'S OFFICE.  EKG REPORT 02/23/2012 ON CHART FROM [facility]. CT ABDOMEN PELVIS 02/23/2012 ON CHART SHOWING AAA 3.0 CM. CBC, BMET, PT, PTT AND CXR WERE DONE TODAY PREOP AT [facility]. DR. ROSE NOTIFIED OF PREOP ORDER FOR ANESTHESIOLOGIST CONSULT--PT HAS CAD, HYPERTENSION, AF AND IS 7:30 AM CASE--PT STATES NO PAST PROBLEMS WITH ANESTHESIA--DR. ROSE STATES HE IS UNABLE TO SEE PT TODAY--BUT ANESTHESIOLOGIST WILL SEE PT AM OF SURGERY IN HOLDING AREA.

## 2012-03-18 NOTE — Patient Instructions (Signed)
YOUR SURGERY IS SCHEDULED ON: Thursday  5/9  AT 7:30 AM  REPORT TO [place] [facility] AT: 5:30 AM      PHONE # FOR SHORT STAY IS [phone]  DO NOT EAT OR DRINK ANYTHING AFTER MIDNIGHT THE NIGHT BEFORE YOUR SURGERY.  YOU MAY BRUSH YOUR TEETH, RINSE OUT YOUR MOUTH--BUT NO WATER, NO FOOD, NO CHEWING GUM, NO MINTS, NO CANDIES, NO CHEWING TOBACCO.  PLEASE TAKE THE FOLLOWING MEDICATIONS THE AM OF YOUR SURGERY WITH A FEW SIPS OF WATER: AMIODARONE, LEVOTHYROXIN, BYSTOLIC    IF YOU USE INHALERS--USE YOUR INHALERS THE AM OF YOUR SURGERY AND BRING INHALERS TO THE HOSPITAL -TAKE TO SURGERY.    IF YOU ARE DIABETIC:  DO NOT TAKE ANY DIABETIC MEDICATIONS THE AM OF YOUR SURGERY.  IF YOU TAKE INSULIN IN THE EVENINGS--PLEASE ONLY TAKE 1/2 NORMAL EVENING DOSE THE NIGHT BEFORE YOUR SURGERY.  NO INSULIN THE AM OF YOUR SURGERY.  IF YOU HAVE SLEEP APNEA AND USE CPAP OR BIPAP--PLEASE BRING THE MASK --NOT THE MACHINE-NOT THE TUBING   -JUST THE MASK. DO NOT BRING VALUABLES, MONEY, CREDIT CARDS.  CONTACT LENS, DENTURES / PARTIALS, GLASSES SHOULD NOT BE WORN TO SURGERY AND IN MOST CASES-HEARING AIDS WILL NEED TO BE REMOVED.  BRING YOUR GLASSES CASE, ANY EQUIPMENT NEEDED FOR YOUR CONTACT LENS. FOR PATIENTS ADMITTED TO THE HOSPITAL--CHECK OUT TIME THE DAY OF DISCHARGE IS 11:00 AM.  ALL INPATIENT ROOMS ARE PRIVATE - WITH BATHROOM, TELEPHONE, TELEVISION AND WIFI INTERNET. IF YOU ARE BEING DISCHARGED THE SAME DAY OF YOUR SURGERY--YOU CAN NOT DRIVE YOURSELF HOME--AND SHOULD NOT GO HOME ALONE BY TAXI OR BUS.  NO DRIVING OR OPERATING MACHINERY FOR 24 HOURS FOLLOWING ANESTHESIA / PAIN MEDICATIONS.                            SPECIAL INSTRUCTIONS:  CHLORHEXIDINE SOAP SHOWER (other brand names are Betasept and Hibiclens ) PLEASE SHOWER WITH CHLORHEXIDINE THE NIGHT BEFORE YOUR SURGERY AND THE AM OF YOUR SURGERY. DO NOT USE CHLORHEXIDINE ON YOUR FACE OR PRIVATE AREAS--YOU MAY USE YOUR NORMAL SOAP THOSE AREAS AND YOUR  NORMAL SHAMPOO.  WOMEN SHOULD AVOID SHAVING UNDER ARMS AND SHAVING LEGS 48 HOURS BEFORE USING CHLORHEXIDINE TO AVOID SKIN IRRITATION.  DO NOT USE IF ALLERGIC TO CHLORHEXIDINE.  PLEASE READ OVER ANY  FACT SHEETS THAT YOU WERE GIVEN: MRSA INFORMATION

## 2012-03-19 ENCOUNTER — Telehealth (INDEPENDENT_AMBULATORY_CARE_PROVIDER_SITE_OTHER): Payer: Self-pay | Admitting: General Surgery

## 2012-03-19 NOTE — Pre-Procedure Instructions (Signed)
BARABARA AT DR. Delice Lesch OFFICE NOTIFIED PT STILL ON COUMADIN AND PREOP PT, INR, PTT ABNORMAL--PT KNOWS TO STOP COUMADIN 5 DAYS PREOP--ORDER IS IN EPIC TO REPEAT PT, INR DAY OF SURGERY-PLEASE LET DR. Biagio Quint KNOW.

## 2012-03-19 NOTE — Telephone Encounter (Signed)
[name] [name], at [facility] pre-surgery testing, calling per her hospital protocol to verbally inform Dr. Biagio Quint of abnormal per-op labs.  Pt is still taking Coumadin, so the PT/ INR are "abnormal" (but theraputic.)  A repeat PT/ INR has already been ordered.  Lab results have been sent to Dr. Delice Lesch in-basket.

## 2012-03-27 NOTE — Anesthesia Preprocedure Evaluation (Addendum)
Anesthesia Evaluation  Patient identified by MRN, date of birth, ID band Patient awake    Reviewed: Allergy & Precautions, H&P , NPO status , Patient's Chart, lab work & pertinent test results, reviewed documented beta blocker date and time   Airway Mallampati: II TM Distance: >3 FB Neck ROM: full    Dental No notable dental hx. (+) Teeth Intact and Dental Advisory Given   Pulmonary neg pulmonary ROS,  breath sounds clear to auscultation  Pulmonary exam normal       Cardiovascular Exercise Tolerance: Good hypertension, Pt. on medications and Pt. on home beta blockers + CAD, + Past MI and + Cardiac Stents negative cardio ROS  + dysrhythmias Atrial Fibrillation Rhythm:Regular Rate:Normal  MI 2008.  Bare metal stents.  AAA 3 cm. Still has not seen vascular surgeon about it. Coumadin. Hx, AF but NSR now.   Neuro/Psych negative neurological ROS  negative psych ROS   GI/Hepatic negative GI ROS, Neg liver ROS,   Endo/Other  negative endocrine ROSHypothyroidism   Renal/GU negative Renal ROS  negative genitourinary   Musculoskeletal   Abdominal   Peds  Hematology negative hematology ROS (+)   Anesthesia Other Findings   Reproductive/Obstetrics negative OB ROS                        Anesthesia Physical Anesthesia Plan  ASA: III  Anesthesia Plan: General   Post-op Pain Management:    Induction: Intravenous  Airway Management Planned: Oral ETT  Additional Equipment:   Intra-op Plan:   Post-operative Plan: Extubation in OR  Informed Consent: I have reviewed the patients History and Physical, chart, labs and discussed the procedure including the risks, benefits and alternatives for the proposed anesthesia with the patient or authorized representative who has indicated his/her understanding and acceptance.   Dental Advisory Given  Plan Discussed with: CRNA and Surgeon  Anesthesia Plan  Comments:       Anesthesia Quick Evaluation

## 2012-03-28 ENCOUNTER — Ambulatory Visit (HOSPITAL_COMMUNITY)
Admission: RE | Admit: 2012-03-28 | Discharge: 2012-03-28 | Disposition: A | Discharge status: Home / Self Care | Payer: Medicare Other | Source: Ambulatory Visit | Attending: General Surgery | Admitting: General Surgery

## 2012-03-28 ENCOUNTER — Encounter (HOSPITAL_COMMUNITY)
Admission: RE | Disposition: A | Discharge status: Home / Self Care | Payer: Self-pay | Source: Ambulatory Visit | Attending: General Surgery

## 2012-03-28 ENCOUNTER — Encounter (HOSPITAL_COMMUNITY): Payer: Self-pay | Admitting: Anesthesiology

## 2012-03-28 ENCOUNTER — Encounter (HOSPITAL_COMMUNITY): Payer: Self-pay | Admitting: *Deleted

## 2012-03-28 ENCOUNTER — Ambulatory Visit (HOSPITAL_COMMUNITY): Payer: Medicare Other | Admitting: Anesthesiology

## 2012-03-28 ENCOUNTER — Ambulatory Visit (HOSPITAL_COMMUNITY): Payer: Medicare Other

## 2012-03-28 DIAGNOSIS — Z79899 Other long term (current) drug therapy: Secondary | ICD-10-CM | POA: Insufficient documentation

## 2012-03-28 DIAGNOSIS — E785 Hyperlipidemia, unspecified: Secondary | ICD-10-CM | POA: Insufficient documentation

## 2012-03-28 DIAGNOSIS — K8010 Calculus of gallbladder with chronic cholecystitis without obstruction: Secondary | ICD-10-CM | POA: Insufficient documentation

## 2012-03-28 DIAGNOSIS — K859 Acute pancreatitis without necrosis or infection, unspecified: Secondary | ICD-10-CM | POA: Insufficient documentation

## 2012-03-28 DIAGNOSIS — Z85820 Personal history of malignant melanoma of skin: Secondary | ICD-10-CM | POA: Insufficient documentation

## 2012-03-28 DIAGNOSIS — I10 Essential (primary) hypertension: Secondary | ICD-10-CM | POA: Insufficient documentation

## 2012-03-28 HISTORY — PX: CHOLECYSTECTOMY: SHX55

## 2012-03-28 LAB — PROTIME-INR: INR: 1.13 (ref 0.00–1.49)

## 2012-03-28 SURGERY — LAPAROSCOPIC CHOLECYSTECTOMY WITH INTRAOPERATIVE CHOLANGIOGRAM
Anesthesia: General | Site: Abdomen | Wound class: Clean Contaminated

## 2012-03-28 MED ORDER — BUPIVACAINE HCL (PF) 0.25 % IJ SOLN
INTRAMUSCULAR | Status: DC | PRN
  Administered 2012-03-28: 15 mL

## 2012-03-28 MED ORDER — LACTATED RINGERS IV SOLN
INTRAVENOUS | Status: DC
  Administered 2012-03-28: 12:00:00 via INTRAVENOUS

## 2012-03-28 MED ORDER — ONDANSETRON HCL 4 MG/2ML IJ SOLN
INTRAMUSCULAR | Status: DC | PRN
  Administered 2012-03-28: 4 mg via INTRAVENOUS

## 2012-03-28 MED ORDER — PROPOFOL 10 MG/ML IV EMUL
INTRAVENOUS | Status: DC | PRN
  Administered 2012-03-28: 120 mg via INTRAVENOUS

## 2012-03-28 MED ORDER — SODIUM CHLORIDE 0.9 % IV SOLN
10.0000 mg | INTRAVENOUS | Status: DC | PRN
  Administered 2012-03-28: 200 ug/min via INTRAVENOUS

## 2012-03-28 MED ORDER — MIDAZOLAM HCL 5 MG/5ML IJ SOLN
INTRAMUSCULAR | Status: DC | PRN
  Administered 2012-03-28: 1 mg via INTRAVENOUS

## 2012-03-28 MED ORDER — LIDOCAINE HCL (CARDIAC) 20 MG/ML IV SOLN
INTRAVENOUS | Status: DC | PRN
  Administered 2012-03-28: 60 mg via INTRAVENOUS

## 2012-03-28 MED ORDER — NEOSTIGMINE METHYLSULFATE 1 MG/ML IJ SOLN
INTRAMUSCULAR | Status: DC | PRN
  Administered 2012-03-28: 2 mg via INTRAVENOUS

## 2012-03-28 MED ORDER — IOHEXOL 300 MG/ML  SOLN
INTRAMUSCULAR | Status: DC | PRN
  Administered 2012-03-28: 5 mL via INTRAVENOUS

## 2012-03-28 MED ORDER — HYDROCODONE-ACETAMINOPHEN 10-325 MG PO TABS
1.0000 | ORAL_TABLET | ORAL | Status: DC | PRN
  Administered 2012-03-28: 1 via ORAL

## 2012-03-28 MED ORDER — LIDOCAINE-EPINEPHRINE (PF) 1 %-1:200000 IJ SOLN
INTRAMUSCULAR | Status: DC | PRN
  Administered 2012-03-28: 15 mL

## 2012-03-28 MED ORDER — GLYCOPYRROLATE 0.2 MG/ML IJ SOLN
INTRAMUSCULAR | Status: DC | PRN
  Administered 2012-03-28: .8 mg via INTRAVENOUS
  Administered 2012-03-28: 0.2 mg via INTRAVENOUS

## 2012-03-28 MED ORDER — CEFAZOLIN SODIUM-DEXTROSE 2-3 GM-% IV SOLR
2.0000 g | INTRAVENOUS | Status: AC
  Administered 2012-03-28: 2 g via INTRAVENOUS

## 2012-03-28 MED ORDER — BUPIVACAINE-EPINEPHRINE PF 0.25-1:200000 % IJ SOLN
INTRAMUSCULAR | Status: AC
  Filled 2012-03-28: qty 30

## 2012-03-28 MED ORDER — SODIUM CHLORIDE 0.9 % IR SOLN
Status: DC | PRN
  Administered 2012-03-28: 1000 mL

## 2012-03-28 MED ORDER — HYDROCODONE-ACETAMINOPHEN 10-325 MG PO TABS: 1.0000 | ORAL_TABLET | ORAL | Status: AC | PRN

## 2012-03-28 MED ORDER — ENOXAPARIN SODIUM 40 MG/0.4ML ~~LOC~~ SOLN: 30.0000 mg | Freq: Once | SUBCUTANEOUS | Status: DC

## 2012-03-28 MED ORDER — IOHEXOL 300 MG/ML  SOLN
INTRAMUSCULAR | Status: AC
  Filled 2012-03-28: qty 1

## 2012-03-28 MED ORDER — ENOXAPARIN SODIUM 40 MG/0.4ML ~~LOC~~ SOLN
SUBCUTANEOUS | Status: AC
  Administered 2012-03-28: 40 mg via SUBCUTANEOUS
  Filled 2012-03-28: qty 0.4

## 2012-03-28 MED ORDER — LACTATED RINGERS IR SOLN
Status: DC | PRN
  Administered 2012-03-28: 1000 mL

## 2012-03-28 MED ORDER — HYDROCODONE-ACETAMINOPHEN 10-325 MG PO TABS
ORAL_TABLET | ORAL | Status: AC
  Administered 2012-03-28: 1 via ORAL
  Filled 2012-03-28: qty 2

## 2012-03-28 MED ORDER — LIDOCAINE HCL 1 % IJ SOLN
INTRAMUSCULAR | Status: AC
  Filled 2012-03-28: qty 20

## 2012-03-28 MED ORDER — HYDROMORPHONE HCL PF 1 MG/ML IJ SOLN: 0.2500 mg | INTRAMUSCULAR | Status: DC | PRN

## 2012-03-28 MED ORDER — LACTATED RINGERS IV SOLN
INTRAVENOUS | Status: DC
  Administered 2012-03-28: 09:00:00 via INTRAVENOUS
  Administered 2012-03-28: 1000 mL via INTRAVENOUS

## 2012-03-28 MED ORDER — ROCURONIUM BROMIDE 100 MG/10ML IV SOLN
INTRAVENOUS | Status: DC | PRN
  Administered 2012-03-28: 10 mg via INTRAVENOUS
  Administered 2012-03-28: 40 mg via INTRAVENOUS

## 2012-03-28 MED ORDER — CEFAZOLIN SODIUM-DEXTROSE 2-3 GM-% IV SOLR
INTRAVENOUS | Status: AC
  Filled 2012-03-28: qty 50

## 2012-03-28 MED ORDER — FENTANYL CITRATE 0.05 MG/ML IJ SOLN
INTRAMUSCULAR | Status: DC | PRN
  Administered 2012-03-28 (×4): 50 ug via INTRAVENOUS

## 2012-03-28 SURGICAL SUPPLY — 41 items
ADH SKN CLS APL DERMABOND .7 (GAUZE/BANDAGES/DRESSINGS) ×1
APPLICATOR COTTON TIP 6IN STRL (MISCELLANEOUS) IMPLANT
APPLIER CLIP ROT 10 11.4 M/L (STAPLE) ×2
APR CLP MED LRG 11.4X10 (STAPLE) ×1
BAG SPEC RTRVL LRG 6X4 10 (ENDOMECHANICALS) ×1
CABLE HIGH FREQUENCY MONO STRZ (ELECTRODE) ×2 IMPLANT
CANISTER SUCTION 2500CC (MISCELLANEOUS) ×2 IMPLANT
CATH REDDICK CHOLANGI 4FR 50CM (CATHETERS) ×2 IMPLANT
CHLORAPREP W/TINT 26ML (MISCELLANEOUS) ×2 IMPLANT
CLIP APPLIE ROT 10 11.4 M/L (STAPLE) ×1 IMPLANT
CLOTH BEACON ORANGE TIMEOUT ST (SAFETY) ×2 IMPLANT
COVER SURGICAL LIGHT HANDLE (MISCELLANEOUS) ×2 IMPLANT
DECANTER SPIKE VIAL GLASS SM (MISCELLANEOUS) ×2 IMPLANT
DERMABOND ADVANCED (GAUZE/BANDAGES/DRESSINGS) ×1
DERMABOND ADVANCED .7 DNX12 (GAUZE/BANDAGES/DRESSINGS) IMPLANT
DRAPE C-ARM 42X72 X-RAY (DRAPES) ×1 IMPLANT
DRAPE LAPAROSCOPIC ABDOMINAL (DRAPES) ×2 IMPLANT
ELECT REM PT RETURN 9FT ADLT (ELECTROSURGICAL) ×2
ELECTRODE REM PT RTRN 9FT ADLT (ELECTROSURGICAL) ×1 IMPLANT
ENDOLOOP SUT PDS II  0 18 (SUTURE) ×1
ENDOLOOP SUT PDS II 0 18 (SUTURE) ×1 IMPLANT
GLOVE SURG SS PI 7.5 STRL IVOR (GLOVE) ×4 IMPLANT
GOWN STRL NON-REIN LRG LVL3 (GOWN DISPOSABLE) ×2 IMPLANT
GOWN STRL REIN XL XLG (GOWN DISPOSABLE) ×4 IMPLANT
HEMOSTAT SURGICEL 4X8 (HEMOSTASIS) ×1 IMPLANT
IV CATH 14GX2 1/4 (CATHETERS) ×2 IMPLANT
KIT BASIN OR (CUSTOM PROCEDURE TRAY) ×2 IMPLANT
NS IRRIG 1000ML POUR BTL (IV SOLUTION) ×2 IMPLANT
PENCIL BUTTON HOLSTER BLD 10FT (ELECTRODE) ×2 IMPLANT
POUCH SPECIMEN RETRIEVAL 10MM (ENDOMECHANICALS) ×2 IMPLANT
SCISSORS LAP 5X35 DISP (ENDOMECHANICALS) IMPLANT
SET IRRIG TUBING LAPAROSCOPIC (IRRIGATION / IRRIGATOR) ×1 IMPLANT
STRIP CLOSURE SKIN 1/2X4 (GAUZE/BANDAGES/DRESSINGS) IMPLANT
SUT MNCRL AB 4-0 PS2 18 (SUTURE) ×2 IMPLANT
SUT VICRYL 0 UR6 27IN ABS (SUTURE) ×2 IMPLANT
TOWEL OR 17X26 10 PK STRL BLUE (TOWEL DISPOSABLE) ×2 IMPLANT
TRAY LAP CHOLE (CUSTOM PROCEDURE TRAY) ×2 IMPLANT
TROCAR BALLN 12MMX100 BLUNT (TROCAR) ×2 IMPLANT
TROCAR BLADELESS OPT 5 75 (ENDOMECHANICALS) ×4 IMPLANT
TROCAR XCEL NON-BLD 11X100MML (ENDOMECHANICALS) ×2 IMPLANT
TUBING INSUFFLATION 10FT LAP (TUBING) ×2 IMPLANT

## 2012-03-28 NOTE — Anesthesia Procedure Notes (Addendum)
Procedure Name: Intubation Date/Time: 03/28/2012 8:00 AM Performed by: Kendal Hymen Pre-anesthesia Checklist: Patient identified, Timeout performed, Emergency Drugs available, Suction available and Patient being monitored Patient Re-evaluated:Patient Re-evaluated prior to inductionOxygen Delivery Method: Circle system utilized Preoxygenation: Pre-oxygenation with 100% oxygen Intubation Type: IV induction Ventilation: Mask ventilation without difficulty Laryngoscope Size: Miller and 3 Grade View: Grade I Tube type: Oral Number of attempts: 1 Airway Equipment and Method: Stylet Placement Confirmation: ETT inserted through vocal cords under direct vision,  positive ETCO2 and breath sounds checked- equal and bilateral Secured at: 21 cm Tube secured with: Tape Dental Injury: Teeth and Oropharynx as per pre-operative assessment    Performed by: Kendal Hymen

## 2012-03-28 NOTE — Transfer of Care (Signed)
Immediate Anesthesia Transfer of Care Note  Patient: [name] [name]  Procedure(s) Performed: Procedure(s) (LRB): LAPAROSCOPIC CHOLECYSTECTOMY WITH INTRAOPERATIVE CHOLANGIOGRAM (N/A)  Patient Location: PACU  Anesthesia Type: General  Level of Consciousness: awake, alert  and oriented  Airway & Oxygen Therapy: Patient Spontanous Breathing and Patient connected to face mask oxygen  Post-op Assessment: Report given to PACU RN and Post -op Vital signs reviewed and stable  Post vital signs: Reviewed and stable  Complications: No apparent anesthesia complications

## 2012-03-28 NOTE — Progress Notes (Signed)
PT in with patient.

## 2012-03-28 NOTE — Progress Notes (Signed)
Up and walked in hall and to BR unable to void. Bladder scan shows 506cc

## 2012-03-28 NOTE — Progress Notes (Signed)
Denies need to urinate at present . Continue to infuse IVF and encourage to drink po s.

## 2012-03-28 NOTE — Interval H&P Note (Signed)
History and Physical Interval Note:  03/28/2012 7:35 AM  [name] [name]  has presented today for surgery, with the diagnosis of to remove gallbladder  The various methods of treatment have been discussed with the patient and family. After consideration of risks, benefits and other options for treatment, the patient has consented to  Procedure(s) (LRB): LAPAROSCOPIC CHOLECYSTECTOMY WITH INTRAOPERATIVE CHOLANGIOGRAM (N/A) as a surgical intervention .  The patients' history has been reviewed, patient examined, no change in status, stable for surgery.  I have reviewed the patients' chart and labs.  Questions were answered to the patient's satisfaction.  Pt. Seen in preop area.  The risks of infection, bleeding, pain, persistent symptoms, scarring, injury to bowel or bile ducts, retained stone, diarrhea, need for additional procedures, and need for open surgery discussed with the patient. He expressed understanding and desires to proceed with planned procedure     Lodema Pilot DAVID

## 2012-03-28 NOTE — H&P (View-Only) (Signed)
Patient ID: [name] [name], male   DOB: 05/26/1932, 78 y.o.   MRN: [id number]  Chief Complaint  Patient presents with  . New Evaluation    gallstones    HPI [name] [name] is a 78 y.o. male.   HPI This patient presents for evaluation of right upper quadrant pain and cholelithiasis. Last Thursday he was awoken from his sleep with severe right upper quadrant pain which radiated across his upper abdomen. He cannot describe this but he says it was close there" he attempted to get relief with over-the-counter medication such as Alka-Seltzer but he obtained no relief and he ended up going to the emergency room for evaluation. There he had an ultrasound which demonstrated cholelithiasis and some edema around the pancreatic head concerning for possible pancreatitis he had a followup CT scan which demonstrated cholelithiasis and possible pancreatitis and a 3 cm aortic aneurysm. He will receive some medication in the emergency room and his pain was relieved and he was discharged home. He had a little bit of discomfort at home but since then he has not had any significant symptoms. He denied any associated nausea or vomiting. He denies any heartburn. He states that his bowels are normal. He denies any prior episodes of similar discomfort or any food intolerances. He is on Coumadin for history of atrial fibrillation and history of myocardial infarction in 2008 but was able to come off of this in September for an elective hernia repair.  Past Medical History  Diagnosis Date  . Hypertension   . Hyperlipidemia   . Coronary disease   . CHF (congestive heart failure)   . Systemic AV fistula   . Thyroid disease   . Arthritis   . Cancer   . Vitamin d deficiency   . History of BPH   . Skin cancer   . Hernia   . Prostate disease     Past Surgical History  Procedure Date  . Hernia repair   . Hip surgery 1999 & 2004    Rt and Lt hip  . Melanoma excision     lt forehead  . Joint replacement 12/1995    right    . Joint replacement 05/2003    left    Family History  Problem Relation Age of Onset  . Stroke Mother   . Cancer Brother   . Other Father     upper GI hemorrhage    Social History History  Substance Use Topics  . Smoking status: Former Smoker  . Smokeless tobacco: Former User    Quit date: 06/16/1983  . Alcohol Use: Yes     1 glass of red wine daily - 2 on special occasions    No Known Allergies  Current Outpatient Prescriptions  Medication Sig Dispense Refill  . acetaminophen (TYLENOL) 650 MG CR tablet Take 650 mg by mouth as needed.        . amiodarone (PACERONE) 200 MG tablet Take 200 mg by mouth daily.        . betamethasone dipropionate (DIPROLENE) 0.05 % cream Apply topically as needed.       . calcium carbonate (TUMS EX) 750 MG chewable tablet Chew 1 tablet by mouth as needed.        . Cholecalciferol (VITAMIN D3) 2000 UNITS TABS Take by mouth daily.        . ezetimibe (ZETIA) 10 MG tablet Take 10 mg by mouth daily.        . fluorouracil (EFUDEX) 5 %   cream Apply topically as needed.       . HYDROcodone-acetaminophen (NORCO) 5-325 MG per tablet Take 2 tablets by mouth every 4 (four) hours as needed for pain.  10 tablet  0  . levothyroxine (SYNTHROID, LEVOTHROID) 88 MCG tablet Take 88 mcg by mouth daily.        . nebivolol (BYSTOLIC) 5 MG tablet Take 5 mg by mouth daily.        . ondansetron (ZOFRAN) 4 MG tablet Take 1 tablet (4 mg total) by mouth every 6 (six) hours.  12 tablet  0  . rosuvastatin (CRESTOR) 20 MG tablet Take 20 mg by mouth daily.        . warfarin (COUMADIN) 2.5 MG tablet Take 2.5 mg by mouth daily.          Review of Systems Review of Systems All other review of systems negative or noncontributory except as stated in the HPI  Blood pressure 108/64, pulse 80, temperature 97.3 F (36.3 C), temperature source Temporal, resp. rate 16, height 5' 9" (1.753 m), weight 179 lb 6 oz (81.364 kg).  Physical Exam Physical Exam Physical Exam  Vitals  reviewed. Constitutional: He is oriented to person, place, and time. He appears well-developed and well-nourished. No distress.  HENT:  Head: Normocephalic and atraumatic.  Mouth/Throat: No oropharyngeal exudate.  Eyes: Conjunctivae and EOM are normal. Pupils are equal, round, and reactive to light. Right eye exhibits no discharge. Left eye exhibits no discharge. No scleral icterus.  Neck: Normal range of motion. No tracheal deviation present.  Cardiovascular: Normal rate, regular rhythm and normal heart sounds.   Pulmonary/Chest: Effort normal and breath sounds normal. No stridor. No respiratory distress. He has no wheezes. He has no rales. He exhibits no tenderness.  Abdominal: Soft. Bowel sounds are normal. He exhibits no distension and no mass. There is no tenderness. There is no rebound and no guarding.  Musculoskeletal: Normal range of motion. He exhibits no edema and no tenderness.  Neurological: He is alert and oriented to person, place, and time.  Skin: Skin is warm and dry. No rash noted. He is not diaphoretic. No erythema. No pallor.  Psychiatric: He has a normal mood and affect. His behavior is normal. Judgment and thought content normal.    Data Reviewed US, CT, labs  Assessment    Symptomatic cholelithiasis and possible gallstone pancreatitis His symptoms are concerning for symptomatic cholelithiasis and even possible choledocholithiasis and gallstone pancreatitis. He had mild elevation of his lipase and elevation of his bilirubin as well concerning for possible passage of common bile duct stone. He does have gallstones on ultrasound and on CT scan and his history is fairly good for symptomatic cholelithiasis. I discussed with him the options for cholecystectomy and the risks of the procedure. The risks of infection, bleeding, pain, persistent symptoms, scarring, injury to bowel or bile ducts, retained stone, diarrhea, need for additional procedures, and need for open surgery  discussed with the patient.He would like to proceed with laparoscopic cholecystectomy with possible couldn't examine possible open surgery. He recently received clearance for an elective inguinal hernia surgery in September by his doctor, Dr. Turner. I think it would be reasonable to stop his Coumadin preoperatively since he was on this for atrial fibrillation.     Plan    We will set him up for cholecystectomy when convenient for the patient. We will plan to stop his Coumadin for 5-7 days preoperatively.       Parker Sawatzky DAVID 03/01/2012,   10:43 AM    

## 2012-03-28 NOTE — Brief Op Note (Signed)
03/28/2012  9:17 AM  PATIENT:  [name] [name]  78 y.o. male  PRE-OPERATIVE DIAGNOSIS:  to remove gallbladder  POST-OPERATIVE DIAGNOSIS:  to remove gallbladder  PROCEDURE:  Procedure(s) (LRB): LAPAROSCOPIC CHOLECYSTECTOMY WITH INTRAOPERATIVE CHOLANGIOGRAM (N/A)  SURGEON:  Surgeon(s) and Role:    * Lodema Pilot, DO - Primary  PHYSICIAN ASSISTANT:   ASSISTANTS: none   ANESTHESIA:   general  EBL:  Total I/O In: 600 [I.V.:600] Out: -   BLOOD ADMINISTERED:none  DRAINS: none   LOCAL MEDICATIONS USED:  MARCAINE    and LIDOCAINE   SPECIMEN:  Source of Specimen:  gallbladder  DISPOSITION OF SPECIMEN:  PATHOLOGY  COUNTS:  YES  TOURNIQUET:  * No tourniquets in log *  DICTATION: .Other Dictation: Dictation Number [id number]  PLAN OF CARE: Discharge to home after PACU  PATIENT DISPOSITION:  PACU - hemodynamically stable.   Delay start of Pharmacological VTE agent (>24hrs) due to surgical blood loss or risk of bleeding: no

## 2012-03-28 NOTE — Anesthesia Postprocedure Evaluation (Signed)
  Anesthesia Post-op Note  Patient: [name] [name]  Procedure(s) Performed: Procedure(s) (LRB): LAPAROSCOPIC CHOLECYSTECTOMY WITH INTRAOPERATIVE CHOLANGIOGRAM (N/A)  Patient Location: PACU  Anesthesia Type: General  Level of Consciousness: awake and alert   Airway and Oxygen Therapy: Patient Spontanous Breathing  Post-op Pain: mild  Post-op Assessment: Post-op Vital signs reviewed, Patient's Cardiovascular Status Stable, Respiratory Function Stable, Patent Airway and No signs of Nausea or vomiting  Post-op Vital Signs: stable  Complications: No apparent anesthesia complications

## 2012-03-29 NOTE — Op Note (Signed)
NAME:  [name] [name], [name] [name] NO.:  [id number]  MEDICAL RECORD NO.:  [id number]  LOCATION:  [facility]                         FACILITY:  [facility]  PHYSICIAN:  Lodema Pilot, MD       DATE OF BIRTH:  [birth year]  DATE OF PROCEDURE:  03/28/2012 DATE OF DISCHARGE:  03/28/2012                              OPERATIVE REPORT   PROCEDURE:  Laparoscopic cholecystectomy with intraoperative cholangiogram.  PREOPERATIVE DIAGNOSIS:  Gallstone pancreatitis.  POSTOPERATIVE DIAGNOSIS:  Gallstone pancreatitis.  SURGEON:  Lodema Pilot, MD  ASSISTANT:  None.  ANESTHESIA:  General.  Endotracheal anesthesia with 30 mL of 1% lidocaine with epinephrine and 0.25% Marcaine in a 50:50 mixture.  FLUIDS:  1 L crystalloid.  ESTIMATED BLOOD LOSS:  Minimal.  DRAINS:  None.  SPECIMENS:  Gallbladder and content sent to Pathology for permanent sectioning.  COMPLICATIONS:  None apparent.  FINDINGS:  Fatty gallbladder with an anterior cystic artery, but otherwise normal anatomy and normal cholangiogram.  No evidence of retained gallstones.  Endoloop was placed around the cystic duct.  OPERATIVE DETAILS:  Mr. Peatross was seen and evaluated in the preoperative area and risks and benefits of the procedure were discussed in lay terms.  Informed consent was obtained., the patient is prophylactic antibiotics were given.  He was taken to the operating room, placed on the table in supine position and general endotracheal anesthesia was obtained.  A supraumbilical midline incision was made.  His abdomen was prepped and draped in a standard surgical fashion and procedure time-out was performed with all operative team members to confirm proper patient, procedure, and a supraumbilical midline incision was made in the skin and dissection carried down to the subcutaneous tissue using blunt dissection.  The abdominal wall fascia was elevated and sharply incised and the peritoneum entered bluntly.  A 12 mm balloon  port was placed at the umbilicus and pneumoperitoneum was obtained.  There was no evidence of bowel injury upon entry.  He has had a right inguinal hernia repair. The repair appeared to be adequate.  Two 5 mm right upper quadrant trocars were placed under direct visualization and an 11 mm epigastric trocar was placed also under direct visualization.  The peritoneum and fatty adhesions to the gallbladder were taken down from the gallbladder using blunt dissection.  He has what appeared to be a cystic artery in the anterior portion of the gallbladder and this was dissected, skeletonized and clipped between hemoclips.  It was not divided at this time.  The cystic duct was identified as well posterior to this in the past, this was skeletonized and a critical view of safety was obtained opening up the triangle of Calot to visualize the liver parenchyma through the triangle of Calot.  The duct was skeletonized, a clip was placed on the gallbladder side of the duct and a cholangiogram catheter was passed through the abdominal wall and the small cystic ductotomy was made.  Cholangiogram catheter was passed in the cystic duct, clipped into place and cholangiogram was performed, which demonstrated a long cystic duct, normal common bile duct and right and left hepatic ducts and free flow of bile into the duodenum.  There was no evidence of retained gallstones.  The catheter was removed and the duct was clipped with hemoclips, however, the clips appeared to go just to the edge of the cystic duct, and so I also placed a 0 PDS Endoloop around the cystic duct stump for added security.  The previously clipped artery was divided as well, and the gallbladder was then removed from the gallbladder fossa using Bovie electrocautery.  Gallbladder was not entered during the dissection.  There was no evidence of spillage of any gallstones.  The gallbladder was completely removed from the gallbladder fossa, it was  placed in an EndoCatch bag, but not removed at this time. The gallbladder fossa was inspected for hemostasis, which was noted be adequate and the right upper quadrant was irrigated with sterile saline solution and suctioned until the irrigation returned clear.  There was no evidence of bleeding or bowel injury.  Surgicel gauze was placed in the gallbladder fossa given the fact that the patient is on Coumadin and will need to restart his Coumadin soon even though I was satisfied with hemostasis in the gallbladder fossa.  The gallbladder was removed from the umbilical fascial incision in an EndoCatch bag, and had some palpable gallstones and this was sent to Pathology for permanent sectioning.  The camera was reintroduced in the abdomen.  The gallbladder fossa was noted be adequate without any evidence of bleeding or bowel injury.  The right upper quadrant trocar was removed under direct visualization.  The abdominal wall was noted to be hemostatic. The umbilical trocar was removed and the fascia was approximated with 0 Vicryl sutures in an open fashion.  The sutures were secured and the abdomen was reinsufflated with carbon dioxide gas and the abdominal wall closure was noted be adequate without any evidence of bowel injury.  The gas was removed and the epigastric trocar was removed.  The skin edges were injected with a total of 30 mL of 1% lidocaine with epinephrine and 0.25% Marcaine in a 50:50 mixture, and the skin edges were approximated with a 4-0 Monocryl subcutaneous suture.  The skin was washed and dried and Dermabond was applied.  All sponge, needle and instrument counts were correct at the end of the case.  The patient tolerated the procedure well without apparent complications.          ______________________________ Lodema Pilot, MD     BL/MEDQ  D:  03/28/2012  T:  03/29/2012  Job:  [id number]

## 2012-04-01 NOTE — OR Nursing (Signed)
Remove the possible from the procedure after checking surgeon post op notes 04/01/12 Howell Rucks RN

## 2012-04-02 ENCOUNTER — Encounter: Payer: Self-pay | Admitting: Vascular Surgery

## 2012-04-03 ENCOUNTER — Ambulatory Visit (INDEPENDENT_AMBULATORY_CARE_PROVIDER_SITE_OTHER): Payer: Medicare Other | Admitting: Vascular Surgery

## 2012-04-03 ENCOUNTER — Encounter: Payer: Self-pay | Admitting: Vascular Surgery

## 2012-04-03 VITALS — BP 109/61 | HR 72 | Resp 16 | Ht 69.0 in | Wt 181.0 lb

## 2012-04-03 DIAGNOSIS — I714 Abdominal aortic aneurysm, without rupture, unspecified: Secondary | ICD-10-CM | POA: Insufficient documentation

## 2012-04-03 NOTE — Progress Notes (Signed)
[facility] [place]  Patient name: [name] [name] MRN: [id number] DOB: 09-18-1932 Sex: male  REASON FOR CONSULT: 3 cm abdominal aortic aneurysm  HPI: [name] [name] is a 78 y.o. male who had developed the sudden onset of abdominal pain in April. He underwent a CT scan which showed gallstone pancreatitis. He essentially had a cholecystectomy and has done well from that standpoint. Incidentally it was noted that his distal aorta measured 3 cm in maximum diameter. This was interpreted as showing a small aneurysm by the radiologist. He was sent for vascular consultation.  He states that his abdominal pain has improved. He's had no acute onset abdominal pain or back pain short of the previous episode.  Past Medical History  Diagnosis Date  . Hypertension   . Hyperlipidemia   . Coronary disease   . Systemic AV fistula   . Thyroid disease   . Arthritis   . Cancer   . Vitamin d deficiency   . History of BPH   . Skin cancer   . Hernia   . Prostate disease   . Coronary artery disease   . Dysrhythmia     ATRIAL FIB  . Gallstones   . AAA (abdominal aortic aneurysm)     3 CM SEEN ON CT OF ABDOMEN--HAS APPT TO SEE VASCULAR SURGEON IN MAY 15TH, 2013  . Myocardial infarction [place], 2008    BARE-METAL STENTS  . 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 BALAN  . Scoliosis     WITH DDD AND BACK PAIN    Family History  Problem Relation Age of Onset  . Stroke Mother   . Cancer Brother   . Other Father     upper GI hemorrhage    SOCIAL HISTORY: History  Substance Use Topics  . Smoking status: Former Games developer  . Smokeless tobacco: Former Neurosurgeon    Quit date: 06/16/1983  . Alcohol Use: Yes     1 glass of red wine daily - 2 on special occasions    No Known Allergies  Current Outpatient Prescriptions  Medication Sig Dispense Refill  . acetaminophen (TYLENOL) 650 MG CR tablet Take 650 mg by mouth as needed.       Marland Kitchen amiodarone (PACERONE) 200 MG  tablet Take 200 mg by mouth every morning.       . betamethasone dipropionate (DIPROLENE) 0.05 % cream Apply topically as needed.       . calcium carbonate (TUMS EX) 750 MG chewable tablet Chew 1 tablet by mouth as needed. hearburn      . Cholecalciferol (VITAMIN D3) 2000 UNITS TABS Take 50 mcg by mouth daily.       Marland Kitchen ezetimibe (ZETIA) 10 MG tablet Take 10 mg by mouth at bedtime.       . fluorouracil (EFUDEX) 5 % cream Apply topically as needed. FOR TX OF CANCEROUS AREAS OF SCALP      . HYDROcodone-acetaminophen (NORCO) 10-325 MG per tablet Take 1 tablet by mouth every 4 (four) hours as needed for pain.  40 tablet  0  . levothyroxine (SYNTHROID, LEVOTHROID) 88 MCG tablet Take 88 mcg by mouth daily.       . nebivolol (BYSTOLIC) 5 MG tablet Take 2.5 mg by mouth every morning.       . rosuvastatin (CRESTOR) 20 MG tablet Take 20 mg by mouth every evening.       . warfarin (COUMADIN) 2.5 MG tablet Take 2.5 mg by mouth daily.  REVIEW OF SYSTEMS: Arly.Keller ] denotes positive finding; [  ] denotes negative finding  CARDIOVASCULAR:  [ ]  chest pain   [ ]  chest pressure   [ ]  palpitations   [ ]  orthopnea   [ ]  dyspnea on exertion   [ ]  claudication   [ ]  rest pain   [ ]  DVT   [ ]  phlebitis PULMONARY:   [ ]  productive cough   [ ]  asthma   [ ]  wheezing NEUROLOGIC:   [ ]  weakness  [ ]  paresthesias  [ ]  aphasia  [ ]  amaurosis  [ ]  dizziness HEMATOLOGIC:   [ ]  bleeding problems   [ ]  clotting disorders MUSCULOSKELETAL:  [ ]  joint pain   [ ]  joint swelling [ ]  leg swelling GASTROINTESTINAL: [ ]   blood in stool  [ ]   hematemesis GENITOURINARY:  [ ]   dysuria  [ ]   hematuria CONSTITUTIONAL:  [ ]  fever   [ ]  chills  PHYSICAL EXAM: Filed Vitals:   04/03/12 1338  BP: 109/61  Pulse: 72  Resp: 16  Height: 5\' 9"  (1.753 m)  Weight: 181 lb (82.101 kg)   Body mass index is 26.73 kg/(m^2). GENERAL: The patient is a well-nourished male, in no acute distress. The vital signs are documented above. CARDIOVASCULAR:  There is a regular rate and rhythm without significant murmur appreciated. I do not detect carotid bruits. He has palpable femoral popliteal and posterior tibial pulses bilaterally. PULMONARY: There is good air exchange bilaterally without wheezing or rales. ABDOMEN: Soft and non-tender with normal pitched bowel sounds. His aorta is palpable and nontender. MUSCULOSKELETAL: There are no major deformities or cyanosis. NEUROLOGIC: No focal weakness or paresthesias are detected. SKIN: There are no ulcers or rashes noted. PSYCHIATRIC: The patient has a normal affect.  DATA:  Lab Results  Component Value Date   WBC 4.6 03/18/2012   HGB 14.5 03/18/2012   HCT 44.3 03/18/2012   MCV 92.9 03/18/2012   PLT 205 03/18/2012   Lab Results  Component Value Date   NA 139 03/18/2012   K 4.2 03/18/2012   CL 108 03/18/2012   CO2 24 03/18/2012   Lab Results  Component Value Date   CREATININE 1.33 03/18/2012   Lab Results  Component Value Date   INR 1.13 03/28/2012   INR 2.37* 03/18/2012   INR 2.50* 02/23/2012   No results found for this basename: HGBA1C   I have independently interpreted his CT scan. He has arteriomegaly. There is no focal aneurysm. His entire infrarenal aorta measures approximately 3 cm in maximum diameter. Thus this is not truly an aneurysm but simply a large artery. Likewise the iliacs are ectatic but not aneurysmal.  MEDICAL ISSUES: This patient presents with a 3 cm abdominal aortic aneurysm by radiologist's interpretation. By my interpretation the patient has arteriomegaly but no focal aneurysm. His entire infrarenal aorta measures 3 cm in maximum diameter. Do not think any further follow up as needed. I be happy to see him back if any new vascular issues arise.   Istvan Behar S [facility] [place] Beeper: [phone]

## 2012-04-18 ENCOUNTER — Encounter (INDEPENDENT_AMBULATORY_CARE_PROVIDER_SITE_OTHER): Payer: Self-pay | Admitting: General Surgery

## 2012-04-18 ENCOUNTER — Ambulatory Visit (INDEPENDENT_AMBULATORY_CARE_PROVIDER_SITE_OTHER): Payer: Medicare Other | Admitting: General Surgery

## 2012-04-18 VITALS — BP 130/86 | HR 71 | Temp 98.6°F | Resp 16 | Ht 69.0 in | Wt 178.0 lb

## 2012-04-18 DIAGNOSIS — Z4889 Encounter for other specified surgical aftercare: Secondary | ICD-10-CM

## 2012-04-18 DIAGNOSIS — Z5189 Encounter for other specified aftercare: Secondary | ICD-10-CM

## 2012-04-18 NOTE — Progress Notes (Signed)
Subjective:     Patient ID: [name] [name], male   DOB: 04-16-1932, 78 y.o.   MRN: [id number]  HPI This patient has a 3 week status post laparoscopic cholecystectomy. His pathology is benign. He is doing very well without complaints. He has returned to normal activities.  Review of Systems     Objective:   Physical Exam No distress and nontoxic-appearing his abdomen is soft and nontender on exam his incisions are healing well.    Assessment:     Status post laparoscopic cholecystectomy-doing well He states he doing very well without any evidence of any postoperative palpitations. His pathology was benign. He can return to activity as tolerated and follow up on a p.r.n. basis    Plan:     F/u prn

## 2012-06-18 ENCOUNTER — Ambulatory Visit: Payer: Medicare Other | Attending: Orthopedic Surgery | Admitting: Rehabilitation

## 2012-06-18 DIAGNOSIS — M2569 Stiffness of other specified joint, not elsewhere classified: Secondary | ICD-10-CM | POA: Insufficient documentation

## 2012-06-18 DIAGNOSIS — M545 Low back pain, unspecified: Secondary | ICD-10-CM | POA: Insufficient documentation

## 2012-06-18 DIAGNOSIS — IMO0001 Reserved for inherently not codable concepts without codable children: Secondary | ICD-10-CM | POA: Insufficient documentation

## 2012-06-20 ENCOUNTER — Ambulatory Visit: Payer: Medicare Other | Attending: Orthopedic Surgery | Admitting: Rehabilitation

## 2012-06-20 DIAGNOSIS — M2569 Stiffness of other specified joint, not elsewhere classified: Secondary | ICD-10-CM | POA: Insufficient documentation

## 2012-06-20 DIAGNOSIS — M545 Low back pain, unspecified: Secondary | ICD-10-CM | POA: Insufficient documentation

## 2012-06-20 DIAGNOSIS — IMO0001 Reserved for inherently not codable concepts without codable children: Secondary | ICD-10-CM | POA: Insufficient documentation

## 2012-06-24 ENCOUNTER — Ambulatory Visit: Payer: Medicare Other | Admitting: Rehabilitation

## 2012-06-26 ENCOUNTER — Ambulatory Visit: Payer: Medicare Other | Admitting: Rehabilitation

## 2012-07-01 ENCOUNTER — Ambulatory Visit: Payer: Medicare Other | Admitting: Rehabilitation

## 2012-07-03 ENCOUNTER — Ambulatory Visit: Payer: Medicare Other | Admitting: Rehabilitation

## 2012-07-08 ENCOUNTER — Ambulatory Visit: Payer: Medicare Other | Admitting: Rehabilitation

## 2012-07-10 ENCOUNTER — Ambulatory Visit: Payer: Medicare Other | Admitting: Rehabilitation

## 2012-07-15 ENCOUNTER — Ambulatory Visit: Payer: Medicare Other | Admitting: Rehabilitation

## 2012-07-17 ENCOUNTER — Ambulatory Visit: Payer: Medicare Other | Admitting: Rehabilitation

## 2012-07-19 ENCOUNTER — Ambulatory Visit: Payer: Medicare Other | Admitting: Rehabilitation

## 2012-07-23 ENCOUNTER — Ambulatory Visit: Payer: Medicare Other | Attending: Orthopedic Surgery | Admitting: Rehabilitation

## 2012-07-23 DIAGNOSIS — M2569 Stiffness of other specified joint, not elsewhere classified: Secondary | ICD-10-CM | POA: Insufficient documentation

## 2012-07-23 DIAGNOSIS — IMO0001 Reserved for inherently not codable concepts without codable children: Secondary | ICD-10-CM | POA: Insufficient documentation

## 2012-07-23 DIAGNOSIS — M545 Low back pain, unspecified: Secondary | ICD-10-CM | POA: Insufficient documentation

## 2012-07-25 ENCOUNTER — Ambulatory Visit: Payer: Medicare Other | Admitting: Rehabilitation

## 2012-07-29 ENCOUNTER — Ambulatory Visit: Payer: Medicare Other | Admitting: Rehabilitation

## 2012-08-26 ENCOUNTER — Other Ambulatory Visit (HOSPITAL_COMMUNITY): Payer: Self-pay | Admitting: Cardiology

## 2012-08-26 DIAGNOSIS — I4891 Unspecified atrial fibrillation: Secondary | ICD-10-CM

## 2012-08-28 ENCOUNTER — Ambulatory Visit (HOSPITAL_COMMUNITY)
Admission: RE | Admit: 2012-08-28 | Discharge: 2012-08-28 | Disposition: A | Discharge status: Home / Self Care | Payer: Medicare Other | Source: Ambulatory Visit | Attending: Cardiology | Admitting: Cardiology

## 2012-08-28 DIAGNOSIS — Z79899 Other long term (current) drug therapy: Secondary | ICD-10-CM | POA: Insufficient documentation

## 2012-08-28 DIAGNOSIS — I4891 Unspecified atrial fibrillation: Secondary | ICD-10-CM | POA: Insufficient documentation

## 2012-11-07 ENCOUNTER — Other Ambulatory Visit: Payer: Self-pay | Admitting: Family Medicine

## 2012-11-07 DIAGNOSIS — R634 Abnormal weight loss: Secondary | ICD-10-CM

## 2012-11-14 ENCOUNTER — Telehealth: Payer: Self-pay | Admitting: Internal Medicine

## 2012-11-14 NOTE — Telephone Encounter (Signed)
S/W PT IN REF TO NP APPT. ON 11/27/12@9 :30 REFERRING DR BARNES DX ABNL CBC MAILED NP PACKET

## 2012-11-14 NOTE — Telephone Encounter (Signed)
C/D 11/14/12 for appt.11/27/12

## 2012-11-18 ENCOUNTER — Ambulatory Visit
Admission: RE | Admit: 2012-11-18 | Discharge: 2012-11-18 | Disposition: A | Discharge status: Home / Self Care | Payer: Medicare Other | Source: Ambulatory Visit | Attending: Family Medicine | Admitting: Family Medicine

## 2012-11-18 DIAGNOSIS — R634 Abnormal weight loss: Secondary | ICD-10-CM

## 2012-11-18 MED ORDER — IOHEXOL 300 MG/ML  SOLN
100.0000 mL | Freq: Once | INTRAMUSCULAR | Status: AC | PRN
  Administered 2012-11-18: 100 mL via INTRAVENOUS

## 2012-11-21 ENCOUNTER — Telehealth: Payer: Self-pay | Admitting: Internal Medicine

## 2012-11-21 NOTE — Telephone Encounter (Signed)
S/W pt in re r/s NP appt 01/13 @ 1:30.  Calendar mailed.

## 2012-11-27 ENCOUNTER — Other Ambulatory Visit: Payer: Medicare Other | Admitting: Lab

## 2012-11-27 ENCOUNTER — Ambulatory Visit: Payer: Medicare Other | Admitting: Internal Medicine

## 2012-11-29 ENCOUNTER — Other Ambulatory Visit: Payer: Self-pay | Admitting: Medical Oncology

## 2012-11-29 DIAGNOSIS — R634 Abnormal weight loss: Secondary | ICD-10-CM

## 2012-12-02 ENCOUNTER — Encounter: Payer: Self-pay | Admitting: Internal Medicine

## 2012-12-02 ENCOUNTER — Ambulatory Visit (HOSPITAL_BASED_OUTPATIENT_CLINIC_OR_DEPARTMENT_OTHER): Payer: Medicare Other | Admitting: Internal Medicine

## 2012-12-02 ENCOUNTER — Ambulatory Visit: Payer: Medicare Other

## 2012-12-02 ENCOUNTER — Other Ambulatory Visit (HOSPITAL_BASED_OUTPATIENT_CLINIC_OR_DEPARTMENT_OTHER): Payer: Medicare Other | Admitting: Lab

## 2012-12-02 VITALS — BP 162/86 | HR 59 | Temp 97.7°F | Resp 20 | Ht 68.5 in | Wt 174.5 lb

## 2012-12-02 DIAGNOSIS — R634 Abnormal weight loss: Secondary | ICD-10-CM

## 2012-12-02 DIAGNOSIS — R7989 Other specified abnormal findings of blood chemistry: Secondary | ICD-10-CM | POA: Insufficient documentation

## 2012-12-02 DIAGNOSIS — E559 Vitamin D deficiency, unspecified: Secondary | ICD-10-CM

## 2012-12-02 DIAGNOSIS — E039 Hypothyroidism, unspecified: Secondary | ICD-10-CM | POA: Insufficient documentation

## 2012-12-02 DIAGNOSIS — E785 Hyperlipidemia, unspecified: Secondary | ICD-10-CM | POA: Insufficient documentation

## 2012-12-02 DIAGNOSIS — D72821 Monocytosis (symptomatic): Secondary | ICD-10-CM

## 2012-12-02 DIAGNOSIS — R0602 Shortness of breath: Secondary | ICD-10-CM

## 2012-12-02 DIAGNOSIS — I2510 Atherosclerotic heart disease of native coronary artery without angina pectoris: Secondary | ICD-10-CM

## 2012-12-02 DIAGNOSIS — I10 Essential (primary) hypertension: Secondary | ICD-10-CM

## 2012-12-02 HISTORY — DX: Hyperlipidemia, unspecified: E78.5

## 2012-12-02 HISTORY — DX: Essential (primary) hypertension: I10

## 2012-12-02 HISTORY — DX: Atherosclerotic heart disease of native coronary artery without angina pectoris: I25.10

## 2012-12-02 HISTORY — DX: Hypothyroidism, unspecified: E03.9

## 2012-12-02 LAB — COMPREHENSIVE METABOLIC PANEL (CC13)
ALT: 30 U/L (ref 0–55)
AST: 49 U/L — ABNORMAL HIGH (ref 5–34)
Calcium: 9.3 mg/dL (ref 8.4–10.4)
Chloride: 111 mEq/L — ABNORMAL HIGH (ref 98–107)
Creatinine: 1.2 mg/dL (ref 0.7–1.3)
Sodium: 139 mEq/L (ref 136–145)
Total Bilirubin: 1.16 mg/dL (ref 0.20–1.20)
Total Protein: 7.5 g/dL (ref 6.4–8.3)

## 2012-12-02 LAB — CBC WITH DIFFERENTIAL/PLATELET
BASO%: 1 % (ref 0.0–2.0)
EOS%: 1.8 % (ref 0.0–7.0)
HCT: 45.8 % (ref 38.4–49.9)
MCH: 31.5 pg (ref 27.2–33.4)
MCHC: 33.5 g/dL (ref 32.0–36.0)
MONO#: 0.7 10*3/uL (ref 0.1–0.9)
NEUT%: 55.4 % (ref 39.0–75.0)
RBC: 4.86 10*6/uL (ref 4.20–5.82)
RDW: 14.7 % — ABNORMAL HIGH (ref 11.0–14.6)
WBC: 5.7 10*3/uL (ref 4.0–10.3)
lymph#: 1.7 10*3/uL (ref 0.9–3.3)

## 2012-12-02 NOTE — Progress Notes (Signed)
[facility] [phone]   [phone]  CONSULT NOTE  REFERRING PHYSICIAN: Dr. Juluis Rainier  REASON FOR CONSULTATION:  79 years old white male with monocytosis.  HPI [name] [name] is a 79 y.o. male was past medical history significant for hypertension, coronary artery disease status post myocardial infarction in 2008, dyslipidemia, paroxysmal atrial fibrillation, hypothyroidism, benign prostatic hypertrophy, osteoarthritis, and vitamin D deficiency. The patient has been complaining over the last 2 years of weight loss that was getting worse recently. He was seen by his primary care physician and CBC on 11/07/2012 showed normal white blood count of 5.4, hemoglobin 14.4, hematocrit 41.6% and platelets count of 188,000. It was noted on his CBC to have elevated monocytes count of 16.9% and absolute value of 900. The patient was referred to me today for evaluation and recommendation regarding his monocytosis. He had several other investigation during that time for evaluation of his weight loss including TSH that was normal at 1.36, PSA of was 0.56, cortisol level was 14.0. He had a previous elevation of his total bilirubin in 2012 but the most recent value on 11/07/2012 was 1.3. The patient was also referred to Dr. Evette Cristal for GI evaluation and she is scheduled for upper endoscopy and colonoscopy in the next 1- 2 weeks. He denied having any specific complaints today except for feeling cold most of the time and he related mostly to hypothyroidism. The patient also has shortness breath with exertion but denied having any significant chest pain, cough or hemoptysis. He denied having any lack of appetite but still losing weight. He had chest x-ray performed in April 2013 that showed no evidence of acute cardiopulmonary disease. He also had CT scan of the abdomen and pelvis performed on 11/18/2012 and it showed no specific explanation of the weight loss or acute process  identified. There was resolved asymmetric enlargement of the pancreatic head and the adjacent soft tissue stranding consistent with resultant pancreatitis. There was extensive atherosclerosis and no evidence of large vessel occlusion. The patient also has stable renal cyst and sigmoid diverticulosis as well as lumbar spondylosis. He denied having any palpable lymphadenopathy.   @SFHPI @  Past Medical History  Diagnosis Date  . Hypertension   . Hyperlipidemia   . Coronary disease   . Systemic AV fistula   . Thyroid disease   . Arthritis   . Cancer   . Vitamin d deficiency   . History of BPH   . Skin cancer   . Hernia   . Prostate disease   . Coronary artery disease   . Dysrhythmia     ATRIAL FIB  . Gallstones   . AAA (abdominal aortic aneurysm)     3 CM SEEN ON CT OF ABDOMEN--HAS APPT TO SEE VASCULAR SURGEON IN MAY 15TH, 2013  . Myocardial infarction [place], 2008    BARE-METAL STENTS  . 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 BALAN  . Scoliosis     WITH DDD AND BACK PAIN    Past Surgical History  Procedure Date  . Melanoma excision 05/2008    lt forehead  . Joint replacement 12/1995    right  . Joint replacement 05/2003    left  . Hernia repair     04/2006 LEFT GROIN HERNIA REPAIR AND 07/2011 WAS RIGHT GROIN HERNIA REPAIR  . Coronary angioplasty 02/2007    HEART STENTS X 2  . Cholecystectomy   . Cholecystectomy 03/28/2012    Procedure:  LAPAROSCOPIC CHOLECYSTECTOMY WITH INTRAOPERATIVE CHOLANGIOGRAM;  Surgeon: Lodema Pilot, DO;  Location: [facility];  Service: General;  Laterality: N/A;  Laparoscopic Cholecystectomy, Cholangiogram,     Family History  Problem Relation Age of Onset  . Stroke Mother   . Cancer Brother   . Other Father     upper GI hemorrhage    Social History History  Substance Use Topics  . Smoking status: Former Smoker    Types: Cigarettes  . Smokeless tobacco: Former Neurosurgeon    Quit date: 06/16/1983  . Alcohol Use: Yes      Comment: 1 glass of red wine daily - 2 on special occasions    No Known Allergies  Current Outpatient Prescriptions  Medication Sig Dispense Refill  . acetaminophen (TYLENOL) 650 MG CR tablet Take 650 mg by mouth as needed.       Marland Kitchen amiodarone (PACERONE) 200 MG tablet Take 200 mg by mouth every morning.       . betamethasone dipropionate (DIPROLENE) 0.05 % cream Apply topically as needed.       . calcium carbonate (TUMS EX) 750 MG chewable tablet Chew 1 tablet by mouth as needed. hearburn      . Cholecalciferol (VITAMIN D3) 2000 UNITS TABS Take 50 mcg by mouth daily.       Marland Kitchen ezetimibe (ZETIA) 10 MG tablet Take 10 mg by mouth at bedtime.       . fluorouracil (EFUDEX) 5 % cream Apply topically as needed. FOR TX OF CANCEROUS AREAS OF SCALP      . HYDROcodone-acetaminophen (NORCO) 10-325 MG per tablet Take 1 tablet by mouth every 4 (four) hours as needed for pain.  40 tablet  0  . levothyroxine (SYNTHROID, LEVOTHROID) 88 MCG tablet Take 88 mcg by mouth daily.       . nebivolol (BYSTOLIC) 5 MG tablet Take 2.5 mg by mouth every morning.       . rosuvastatin (CRESTOR) 20 MG tablet Take 20 mg by mouth every evening.       . warfarin (COUMADIN) 2.5 MG tablet Take 2.5 mg by mouth daily.         Review of Systems  A comprehensive review of systems was negative except for: Constitutional: positive for weight loss Respiratory: positive for dyspnea on exertion  Physical Exam  ZOX:WRUEA, healthy, no distress, well nourished and well developed SKIN: skin color, texture, turgor are normal HEAD: Normocephalic, No masses, lesions, tenderness or abnormalities EYES: normal EARS: External ears normal OROPHARYNX:no exudate and no erythema  NECK: supple, no adenopathy LYMPH:  no palpable lymphadenopathy, no hepatosplenomegaly LUNGS: clear to auscultation  HEART: regular rate & rhythm, no murmurs and no gallops ABDOMEN:abdomen soft, non-tender, normal bowel sounds and no masses or organomegaly BACK:  Back symmetric, no curvature. EXTREMITIES:no joint deformities, effusion, or inflammation, no edema, no skin discoloration, no clubbing  NEURO: alert & oriented x 3 with fluent speech, no focal motor/sensory deficits, gait normal  PERFORMANCE STATUS: ECOG 1  LABORATORY DATA: Lab Results  Component Value Date   WBC 5.7 12/02/2012   HGB 15.3 12/02/2012   HCT 45.8 12/02/2012   MCV 94.3 12/02/2012   PLT 177 12/02/2012      Chemistry      Component Value Date/Time   NA 139 12/02/2012 1332   NA 139 03/18/2012 1355   K 4.0 12/02/2012 1332   K 4.2 03/18/2012 1355   CL 111* 12/02/2012 1332   CL 108 03/18/2012 1355   CO2 22 12/02/2012 1332  CO2 24 03/18/2012 1355   BUN 18.0 12/02/2012 1332   BUN 20 03/18/2012 1355   CREATININE 1.2 12/02/2012 1332   CREATININE 1.33 03/18/2012 1355      Component Value Date/Time   CALCIUM 9.3 12/02/2012 1332   CALCIUM 8.7 03/18/2012 1355   ALKPHOS 95 12/02/2012 1332   ALKPHOS 66 02/23/2012 1506   AST 49* 12/02/2012 1332   AST 34 02/23/2012 1506   ALT 30 12/02/2012 1332   ALT 19 02/23/2012 1506   BILITOT 1.16 12/02/2012 1332   BILITOT 2.0* 02/23/2012 1506       RADIOGRAPHIC STUDIES: Ct Abdomen Pelvis W Contrast  11/18/2012  *RADIOLOGY REPORT*  Clinical Data: Unexplained weight loss.  History of pancreatitis, cholecystectomy and melanoma.  CT ABDOMEN AND PELVIS WITH CONTRAST  Technique:  Multidetector CT imaging of the abdomen and pelvis was performed following the standard protocol during bolus administration of intravenous contrast.  Contrast: OMNIPAQUE IOHEXOL 300 MG/ML  SOLN  Comparison: Abdominal pelvic CT 02/23/2012, intraoperative cholangiogram 03/28/2012 and abdominal ultrasound 02/23/2012.  Findings: The lung bases are clear.  There is no pleural effusion.  Arterial and portal phase imaging through the upper abdomen was performed given the previously demonstrated pancreatic findings. The previously demonstrated heterogeneous enlargement of the pancreatic head  and surrounding inflammatory changes have resolved. Low density within the pancreatic body on image 79 is unchanged. There is no pancreatic or biliary ductal dilatation.  The patient has undergone interval cholecystectomy.  The liver, spleen and adrenal glands appear normal.  Bilateral renal cortical cysts have not substantially changed.  There is no hydronephrosis.  Extensive aorto iliac and proximal visceral atherosclerosis is again noted.  There is stable mild dilatation of the distal abdominal aorta to 2.9 cm.  There is no focal aneurysm.  There is stable ectasia of both common iliac arteries.  No large vessel occlusion is seen.  The stomach and small bowel appear normal.  Sigmoid colon diverticular changes are present without surrounding inflammation. There are no enlarged abdominal pelvic lymph nodes.  Evaluation of the bladder and prostate gland is limited by artifact from the bilateral total hip arthroplasties.  There are small central prostatic calcifications.  There is a moderate convex left lumbar scoliosis with associated spondylosis and a degenerative anterolisthesis at L4-L5.  IMPRESSION:  1.  No specific explanation for weight loss or acute process identified. 2.  Resolved asymmetric enlargement of the pancreatic head and adjacent soft tissue stranding consistent with resolved pancreatitis. 3.  Extensive atherosclerosis as before.  No evidence of large vessel occlusion. 4.  Stable renal cysts, sigmoid diverticulosis and lumbar spondylosis.   Original Report Authenticated By: Carey Bullocks, M.D.     ASSESSMENT: This is a very pleasant 79 years old white male who presented for evaluation of monocytosis seen on process CBC in December of 2013 which is completely resolved at this point. The patient also has unexplained weight loss.  PLAN: I have a lengthy discussion with the patient today about his condition. I assured him off the resolution of his monocytosis which was most likely reactive in  nature secondary to inflammatory process including the previous history of pancreatitis. The patient has unexplained weight loss and I think GI evaluation would be valuable in his condition with repeat upper endoscopy and colonoscopy. His imaging studies are unremarkable especially CT of the abdomen and pelvis. He had previous chest x-ray in April of 2013 that was unremarkable to but if the patient has no explanation for his weight loss  after the GI evaluation, I would recommend for him to have CT scan of the chest to rule out any occult malignancy. The patient may also need evaluation by endocrinology. The patient will continue his routine followup visit with his primary care physician Dr. Zachery Dauer. I don't see a need to see Mr. [name] [name] at regular basis at this point but will be happy to see him in the future if needed. The patient agreed to the current plan.  All questions were answered. The patient knows to call the clinic with any problems, questions or concerns. We can certainly see the patient much sooner if necessary.  Thank you so much for allowing me to participate in the care of [name] [name]. I will continue to follow up the patient with you and assist in his care.  I spent 25 minutes counseling the patient face to face. The total time spent in the appointment was 50 minutes.  Kaylia Winborne K. 12/02/2012, 2:57 PM

## 2012-12-02 NOTE — Progress Notes (Signed)
Checked in new pt with no financial concerns. °

## 2012-12-02 NOTE — Patient Instructions (Signed)
You presented for evaluation of monocytosis which is currently resolved. Followup with your primary care physician for further evaluation and investigation of the weight loss. Followup with me on as needed basis.

## 2012-12-05 ENCOUNTER — Other Ambulatory Visit: Payer: Self-pay | Admitting: Gastroenterology

## 2013-01-26 IMAGING — RF DG CHOLANGIOGRAM OPERATIVE
1 series · 4 of 4 positions shown · non-contrast
Comparison: CT 02/23/2012

CLINICAL DATA: Right upper quadrant pain.

INTRAOPERATIVE CHOLANGIOGRAM

[Series 1: run · 4 of 71 frames shown]
[frame 11/71]
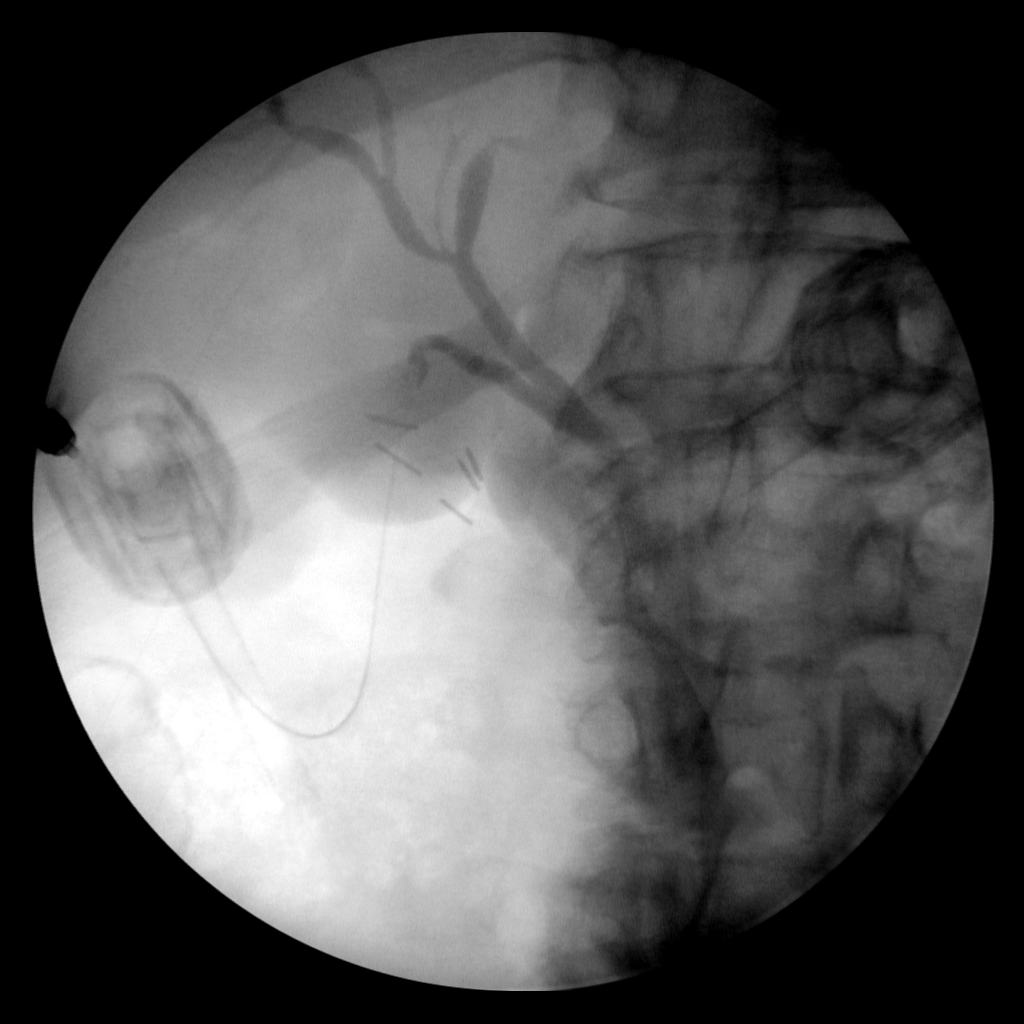
[frame 36/71]
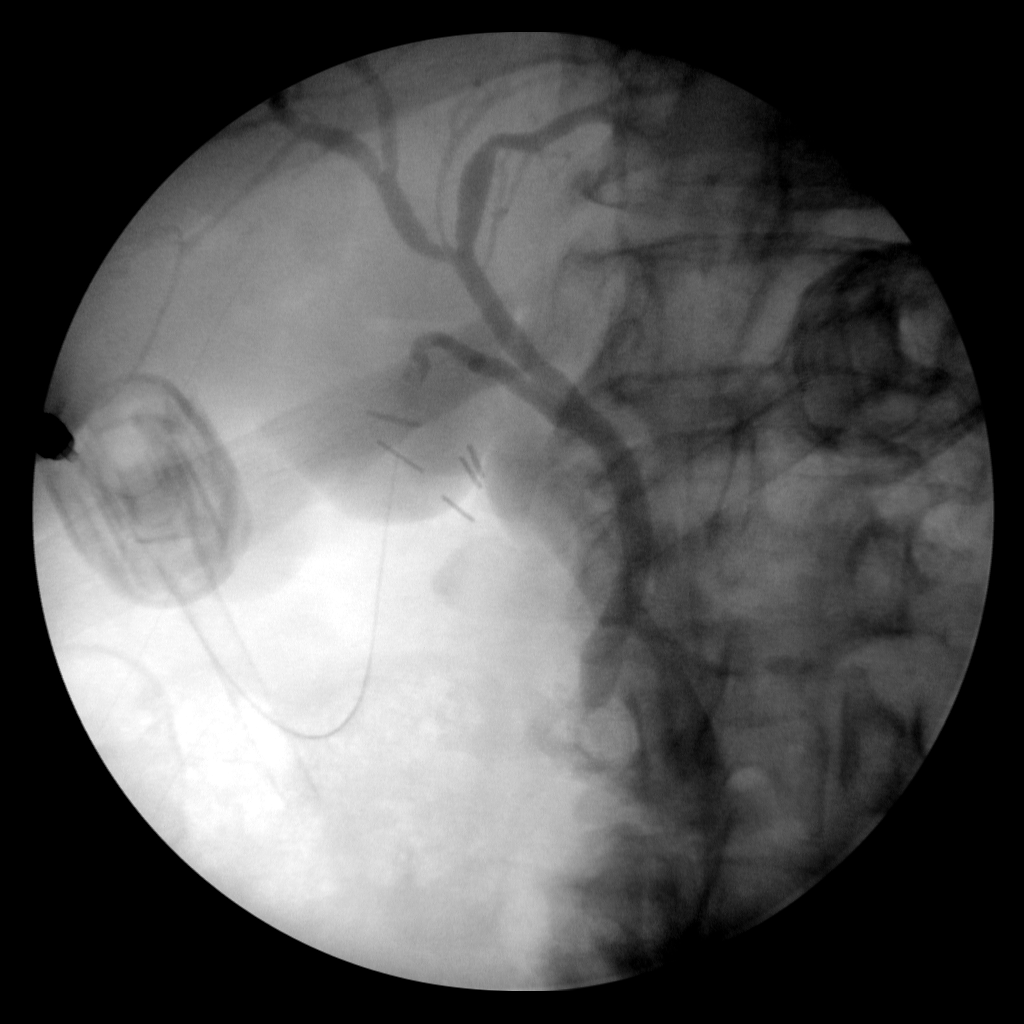
[frame 61/71]
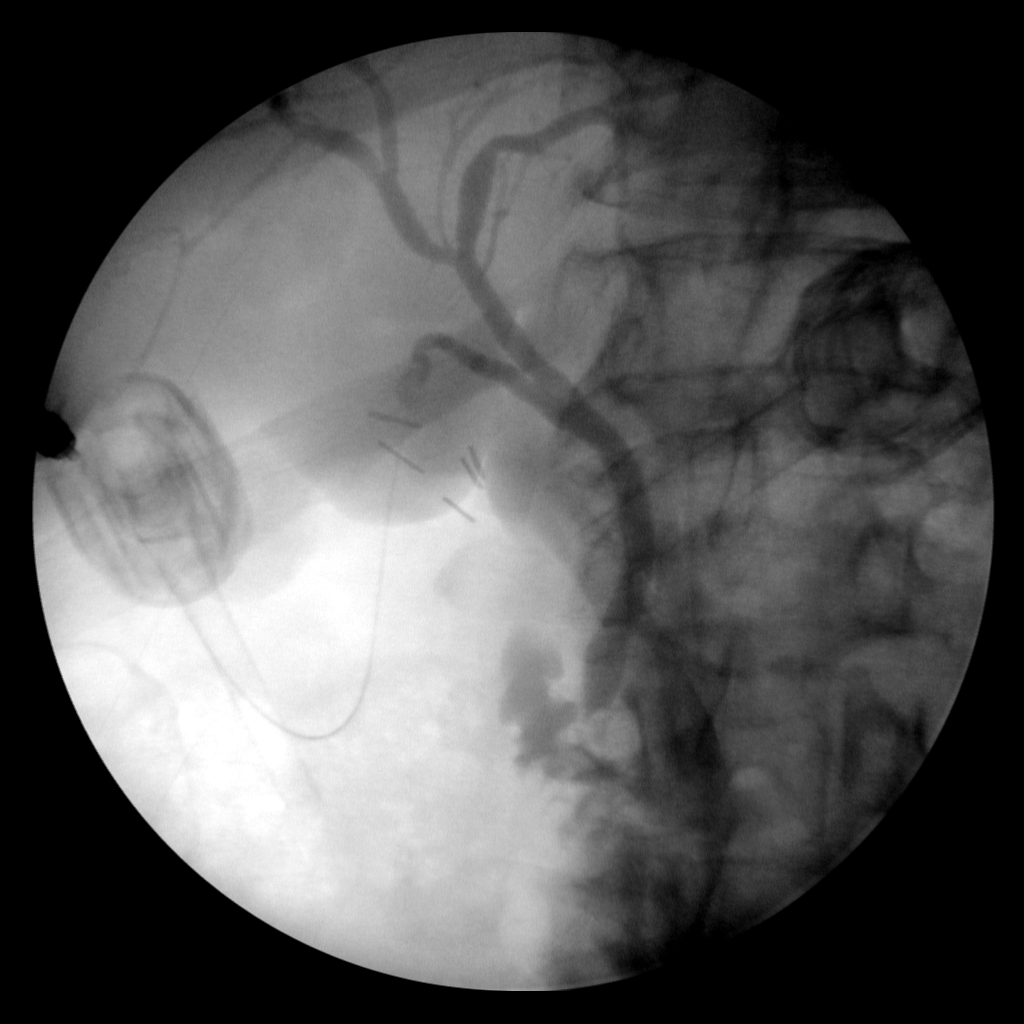
[frame 71/71]
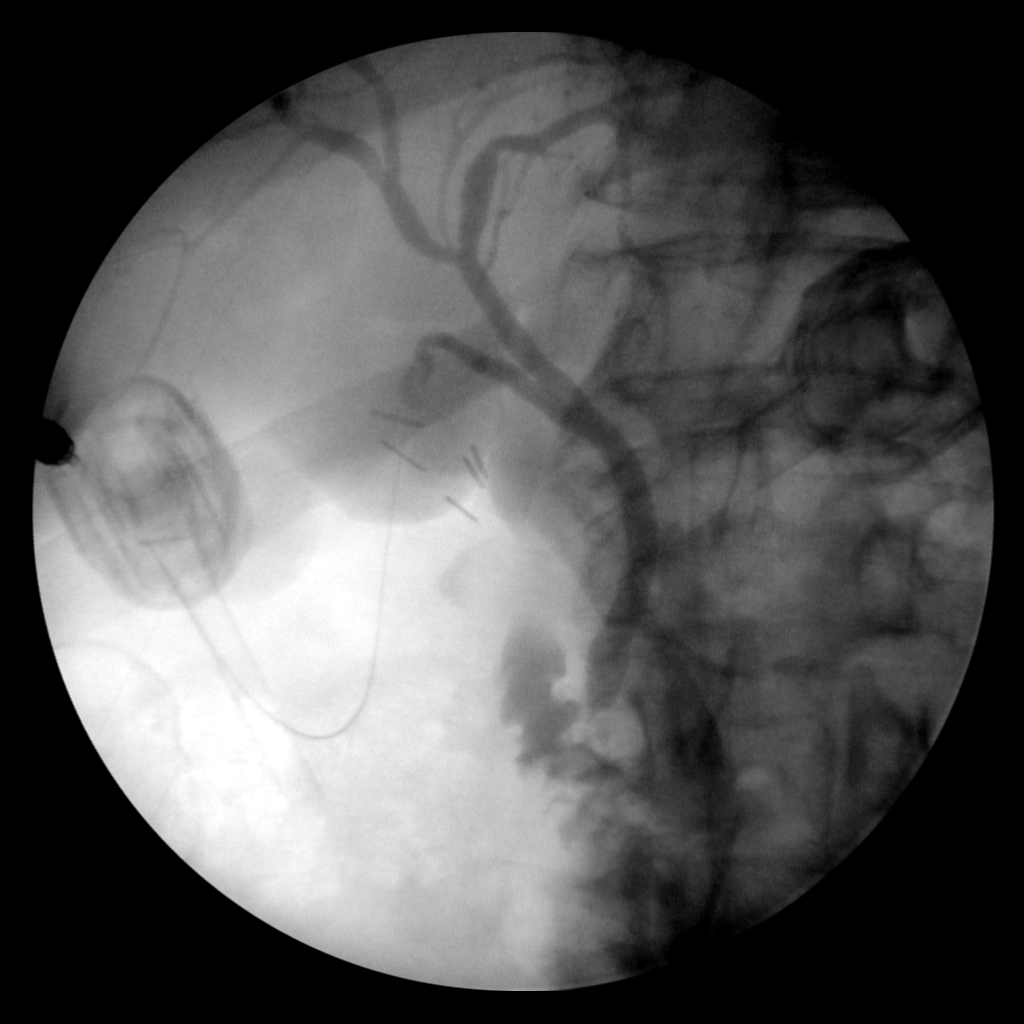

[4 of 4 positions shown; findings below may reference images not displayed]

FINDINGS: Intraoperative spot images show normal caliber biliary
system.  No evidence of retained stone or obstruction.  Free
passage of contrast into the small bowel noted.
IMPRESSION: No evidence of retained stone or obstruction.

These images were submitted for radiologic interpretation only.
Please see the procedural report for the amount of contrast and the
fluoroscopy time utilized.

## 2013-08-12 ENCOUNTER — Other Ambulatory Visit (HOSPITAL_COMMUNITY): Payer: Medicare Other

## 2013-08-16 ENCOUNTER — Other Ambulatory Visit: Payer: Self-pay | Admitting: Cardiology

## 2013-08-16 DIAGNOSIS — E7800 Pure hypercholesterolemia, unspecified: Secondary | ICD-10-CM

## 2013-08-16 DIAGNOSIS — Z79899 Other long term (current) drug therapy: Secondary | ICD-10-CM

## 2013-09-02 ENCOUNTER — Other Ambulatory Visit: Payer: Medicare Other

## 2014-07-31 ENCOUNTER — Encounter: Payer: Self-pay | Admitting: *Deleted

## 2017-12-19 ENCOUNTER — Telehealth: Payer: Self-pay | Admitting: Cardiology

## 2017-12-19 NOTE — Telephone Encounter (Signed)
Pt calling to ask if he was ever placed on the med Cardura, when he was treated by Dr Radford Pax.  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  Pt states he is having cataract surgery, and the Surgeons RN asked if he had ever been placed on the med Cardura.  Pt unsure of the relevance.  Pt hasn't seen Dr Radford Pax since 2011. Informed the pt that as far as our charting system will go back, to show his med history, there was no Cardura listed in his med hx, but that doesn't mean that he was never once prescribed this med.  Informed the pt that his med list only abstracted to year 2013.  Informed the pt that we can't completely rule out that he's never been on this med, with limited information given.  Pt verbalized understanding and gracious for all the assistance provided.

## 2017-12-19 NOTE — Telephone Encounter (Signed)
New message  Pt verbalized that he is calling for the RN  He want a list of his medications that Dr.Turner had him on
